# Patient Record
Sex: Female | Born: 1980 | Race: Black or African American | Hispanic: No | Marital: Single | Smoking: Never smoker
Health system: Southern US, Community
[De-identification: ages and names within clinical notes are randomized; demographics above are authoritative.]

## PROBLEM LIST (undated history)

## (undated) DIAGNOSIS — L500 Allergic urticaria: Secondary | ICD-10-CM

## (undated) DIAGNOSIS — R000 Tachycardia, unspecified: Secondary | ICD-10-CM

## (undated) DIAGNOSIS — I10 Essential (primary) hypertension: Secondary | ICD-10-CM

## (undated) HISTORY — DX: Essential (primary) hypertension: I10

## (undated) HISTORY — DX: Allergic urticaria: L50.0

## (undated) HISTORY — DX: Tachycardia, unspecified: R00.0

## (undated) HISTORY — PX: OTHER SURGICAL HISTORY: SHX169

---

## 2012-01-26 ENCOUNTER — Encounter (HOSPITAL_COMMUNITY): Payer: Self-pay | Admitting: *Deleted

## 2012-01-26 ENCOUNTER — Emergency Department (HOSPITAL_COMMUNITY)
Admission: EM | Admit: 2012-01-26 | Discharge: 2012-01-26 | Disposition: A | Discharge status: Home / Self Care | Payer: 59 | Attending: Emergency Medicine | Admitting: Emergency Medicine

## 2012-01-26 ENCOUNTER — Emergency Department (HOSPITAL_COMMUNITY): Payer: 59

## 2012-01-26 DIAGNOSIS — M549 Dorsalgia, unspecified: Secondary | ICD-10-CM | POA: Insufficient documentation

## 2012-01-26 MED ORDER — DIAZEPAM 5 MG PO TABS: 5.0000 mg | ORAL_TABLET | Freq: Two times a day (BID) | ORAL | Status: AC

## 2012-01-26 NOTE — ED Provider Notes (Signed)
History     CSN: [id number]  Arrival date & time 01/26/12  7829   First MD Initiated Contact with Patient 01/26/12 2142      Chief Complaint  Patient presents with  . Sent by [facility] for chest xray     (Consider location/radiation/quality/duration/timing/severity/associated sxs/prior treatment) HPI Comments: [facility] to arrival and was sent to the ED for a CXR because [facility] felt that breath sounds were decreased on the left side.  Patient is very anxious about this.  [facility] because she has having lower back pain.  Pain located across her lower back.  She reports that the back pain has been present for 2 days.  Pain worse with movements of the lower back.  She describes the pain as tightness.  She reports that pain does become worse with taking a deep breath.  She has not taken anything for pain.  She is unsure if she did any strenuous activity.  She denies any trauma.    Patient is a 31 y.o. female presenting with back pain. The history is provided by the patient.  Back Pain  This is a new problem. The current episode started 2 days ago. The problem occurs constantly. The problem has not changed since onset.The pain is associated with no known injury. The pain does not radiate. The pain is mild. The symptoms are aggravated by twisting and bending. Pertinent negatives include no chest pain, no fever, no numbness, no abdominal pain, no bowel incontinence, no perianal numbness, no bladder incontinence, no dysuria, no leg pain, no paresthesias, no paresis, no tingling and no weakness. She has tried nothing for the symptoms.    History reviewed. No pertinent past medical history.  Past Surgical History  Procedure Date  . Thumb surgery     No family history on file.  History  Substance Use Topics  . Smoking status: Never Smoker   . Smokeless tobacco: Not on file  . Alcohol Use: Yes   occasionally    OB History    Grav Para Term Preterm Abortions TAB SAB Ect Mult Living                  Review of Systems  Constitutional: Negative for fever, chills and diaphoresis.  HENT: Negative for neck pain and neck stiffness.   Respiratory: Negative for cough and shortness of breath.   Cardiovascular: Negative for chest pain and leg swelling.  Gastrointestinal: Negative for nausea, vomiting, abdominal pain and bowel incontinence.  Genitourinary: Negative for bladder incontinence, dysuria, urgency, frequency, hematuria, flank pain, decreased urine volume and difficulty urinating.  Musculoskeletal: Positive for back pain. Negative for gait problem.  Skin: Negative for rash.  Neurological: Negative for dizziness, tingling, syncope, weakness, light-headedness, numbness and paresthesias.    Allergies  Augmentin and Sulfa antibiotics  Home Medications   Current Outpatient Rx  Name Route Sig Dispense Refill  . LEVONORGEST-ETH ESTRAD 91-DAY 0.15-0.03 MG PO TABS Oral Take 1 tablet by mouth daily.    Marland Kitchen LORATADINE 10 MG PO TABS Oral Take 10 mg by mouth daily.    Marland Kitchen DIAZEPAM 5 MG PO TABS Oral Take 1 tablet (5 mg total) by mouth 2 (two) times daily. 10 tablet 0    BP 129/87  Pulse 107  Temp(Src) 98.9 F (37.2 C) (Oral)  Resp 20  SpO2 99%  LMP 12/02/2011  Physical Exam  Nursing note and vitals reviewed. Constitutional: She appears  well-developed and well-nourished.  HENT:  Head: Normocephalic and atraumatic.  Mouth/Throat: Oropharynx is clear and moist.  Neck: Normal range of motion. Neck supple.  Cardiovascular: Normal rate, regular rhythm and normal heart sounds.        No peripheral edema  Pulmonary/Chest: Effort normal and breath sounds normal. No accessory muscle usage. Not tachypneic. No respiratory distress. She has no decreased breath sounds. She has no wheezes. She has no rhonchi. She has no rales. She exhibits no tenderness.  Abdominal: Soft. There is no  tenderness.  Musculoskeletal: Normal range of motion.       Lumbar back: She exhibits normal range of motion, no tenderness, no bony tenderness, no swelling, no edema and no deformity.       Pain with ROM of lower back  Neurological: She is alert. She has normal strength. No sensory deficit. Gait normal.  Reflex Scores:      Patellar reflexes are 2+ on the right side and 2+ on the left side.      Achilles reflexes are 2+ on the right side and 2+ on the left side. Skin: Skin is warm and dry. No rash noted. She is not diaphoretic.  Psychiatric: Her mood appears anxious.    ED Course  Procedures (including critical care time)  Labs Reviewed - No data to display Dg Chest 2 View  01/26/2012  *RADIOLOGY REPORT*  Clinical Data: Pain upon deep inspiration.  CHEST - 2 VIEW  Comparison: No priors.  Findings: There is very mild blunting of the left costophrenic sulcus which may reflect a trace left-sided pleural effusion or some pleural scarring.  Lung volumes are normal.  No consolidative air space disease.  No pneumothorax.  No definite suspicious appearing pulmonary nodules or masses are identified.  Pulmonary vasculature and the cardiomediastinal silhouette are within normal limits.  IMPRESSION: 1.  Possible trace left-sided pleural effusion versus pleural scarring.  Original Report Authenticated By: Florencia Reasons, M.D.     1. Back pain       MDM  Patient presenting with lower back pain.  She was sent to the ED for a CXR after [facility] felt that breath sounds were decreased on left.  On my exam breath sounds were symmetric.  CXR results as above showing possible trace pleural effusion.  Patient denies CP or SOB.  Lower back pain appears to be muscular.  Pain worse with bending and twisting.  No spinous process tenderness.  No neurological deficits and normal neuro exam.  Patient can walk but states is painful.  No loss of bowel or bladder control.  No  concern for cauda equina.  No fever, night sweats, weight loss, h/o cancer, IVDU.  RICE protocol and pain medicine indicated and discussed with patient.         Pascal Lux [place], PA-C 01/27/12 1510

## 2012-01-26 NOTE — Discharge Instructions (Signed)
Take muscle relaxer as needed for back pain.  Do not drive or operate heavy machinery while taking medication.

## 2012-01-26 NOTE — ED Notes (Signed)
[facility], worse with inspiration. Was told by NP to come to ED for chest xray because she didn't hear many breath sounds on L side. Pt denies sob, cough.

## 2012-01-28 NOTE — ED Provider Notes (Signed)
Medical screening examination/treatment/procedure(s) were performed by non-physician practitioner and as supervising physician I was immediately available for consultation/collaboration.    Drezden Seitzinger R Eberardo Demello, MD 01/28/12 1102 

## 2015-08-27 ENCOUNTER — Other Ambulatory Visit: Payer: Self-pay | Admitting: Allergy and Immunology

## 2015-08-27 NOTE — Telephone Encounter (Signed)
PLEASE ADVISE.

## 2015-08-27 NOTE — Telephone Encounter (Signed)
Dr. Willa RoughHicks has pt taking Zyrtec every day. Pt is wondering if we can call in an RX to help bring down the cost. [facility] [address] pls advise

## 2015-08-28 MED ORDER — CETIRIZINE HCL 10 MG PO TABS: 10.0000 mg | ORAL_TABLET | Freq: Every day | ORAL | Status: DC

## 2015-08-28 NOTE — Telephone Encounter (Signed)
Sent in Zyrtec to patients pharmacy and patient notified.

## 2015-08-28 NOTE — Telephone Encounter (Signed)
Yes send  Zyrtec 10mg  once daily. #34 with 3 refills.

## 2015-11-24 ENCOUNTER — Ambulatory Visit (INDEPENDENT_AMBULATORY_CARE_PROVIDER_SITE_OTHER): Payer: Managed Care, Other (non HMO) | Admitting: Family Medicine

## 2015-11-24 VITALS — BP 122/72 | HR 105 | Temp 98.2°F | Resp 17 | Ht 63.0 in | Wt 178.0 lb

## 2015-11-24 DIAGNOSIS — J069 Acute upper respiratory infection, unspecified: Secondary | ICD-10-CM | POA: Diagnosis not present

## 2015-11-24 DIAGNOSIS — R05 Cough: Secondary | ICD-10-CM | POA: Diagnosis not present

## 2015-11-24 DIAGNOSIS — R059 Cough, unspecified: Secondary | ICD-10-CM

## 2015-11-24 MED ORDER — BENZONATATE 100 MG PO CAPS: 100.0000 mg | ORAL_CAPSULE | Freq: Three times a day (TID) | ORAL | Status: DC | PRN

## 2015-11-24 MED ORDER — IPRATROPIUM BROMIDE 0.03 % NA SOLN: 2.0000 | Freq: Two times a day (BID) | NASAL | Status: DC

## 2015-11-24 MED ORDER — GUAIFENESIN-CODEINE 100-10 MG/5ML PO SOLN: 5.0000 mL | Freq: Three times a day (TID) | ORAL | Status: DC | PRN

## 2015-11-24 NOTE — Patient Instructions (Addendum)
Drink plenty of fluids and try to get enough rest  Use the Atrovent (ipratropium) nasal spray 2 sprays in each nostril every 6 hours if needed for head congestion  Take the benzonatate cough pills one or 2 pills 3 times daily as needed for daytime cough. This does not tend to cause drowsiness and you should be able to take it when you're working if needed  Take the guaifenesin with codeine cough syrup 1 teaspoon every 4-6 hours when needed for cough. 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  Upper Respiratory Infection, Adult Most upper respiratory infections (URIs) are a viral infection of the air passages leading to the lungs. A URI affects the nose, throat, and upper air passages. The most common type of URI is nasopharyngitis and is typically referred to as "the common cold." URIs run their course and usually go away on their own. Most of the time, a URI does not require medical attention, but sometimes a bacterial infection in the upper airways can follow a viral infection. This is called a secondary infection. Sinus and middle ear infections are common types of secondary upper respiratory infections. Bacterial pneumonia can also complicate a URI. A URI can worsen asthma and chronic obstructive pulmonary disease (COPD). Sometimes, these complications can require emergency medical care and may be life threatening.  CAUSES Almost all URIs are caused by viruses. A virus is a type of germ and can spread from one person to another.  RISKS FACTORS You may be at risk for a URI if:   You smoke.   You have chronic heart or lung disease.  You have a weakened defense (immune) system.   You are very young or very old.   You have nasal allergies or asthma.  You work in crowded or poorly ventilated areas.  You work in health care facilities or schools. SIGNS AND SYMPTOMS  Symptoms typically develop 2-3 days after you come in contact  with a cold virus. Most viral URIs last 7-10 days. However, viral URIs from the influenza virus (flu virus) can last 14-18 days and are typically more severe. Symptoms may include:   Runny or stuffy (congested) nose.   Sneezing.   Cough.   Sore throat.   Headache.   Fatigue.   Fever.   Loss of appetite.   Pain in your forehead, behind your eyes, and over your cheekbones (sinus pain).  Muscle aches.  DIAGNOSIS  Your health care provider may diagnose a URI by:  Physical exam.  Tests to check that your symptoms are not due to another condition such as:  Strep throat.  Sinusitis.  Pneumonia.  Asthma. TREATMENT  A URI goes away on its own with time. It cannot be cured with medicines, but medicines may be prescribed or recommended to relieve symptoms. Medicines may help:  Reduce your fever.  Reduce your cough.  Relieve nasal congestion. HOME CARE INSTRUCTIONS   Take medicines only as directed by your health care provider.   Gargle warm saltwater or take cough drops to comfort your throat as directed by your health care provider.  Use a warm mist humidifier or inhale steam from a shower to increase air moisture. This may make it easier to breathe.  Drink enough fluid to keep your urine clear or pale yellow.   Eat soups and other clear broths and maintain good nutrition.   Rest as needed.   Return to work when your temperature has  returned to normal or as your health care provider advises. You may need to stay home longer to avoid infecting others. You can also use a face mask and careful hand washing to prevent spread of the virus.  Increase the usage of your inhaler if you have asthma.   Do not use any tobacco products, including cigarettes, chewing tobacco, or electronic cigarettes. If you need help quitting, ask your health care provider. PREVENTION  The best way to protect yourself from getting a cold is to practice good hygiene.    Avoid oral or hand contact with people with cold symptoms.   Wash your hands often if contact occurs.  There is no clear evidence that vitamin C, vitamin E, echinacea, or exercise reduces the chance of developing a cold. However, it is always recommended to get plenty of rest, exercise, and practice good nutrition.  SEEK MEDICAL CARE IF:   You are getting worse rather than better.   Your symptoms are not controlled by medicine.   You have chills.  You have worsening shortness of breath.  You have brown or red mucus.  You have yellow or brown nasal discharge.  You have pain in your face, especially when you bend forward.  You have a fever.  You have swollen neck glands.  You have pain while swallowing.  You have white areas in the back of your throat. SEEK IMMEDIATE MEDICAL CARE IF:   You have severe or persistent:  Headache.  Ear pain.  Sinus pain.  Chest pain.  You have chronic lung disease and any of the following:  Wheezing.  Prolonged cough.  Coughing up blood.  A change in your usual mucus.  You have a stiff neck.  You have changes in your:  Vision.  Hearing.  Thinking.  Mood. MAKE SURE YOU:   Understand these instructions.  Will watch your condition.  Will get help right away if you are not doing well or get worse.  This information is not intended to replace advice given to you by your health care provider. Make sure you discuss any questions you have with your health care provider.  Document Released: 02/03/2001 Document Revised: 12/25/2014 Document Reviewed: 11/15/2013 Elsevier Interactive Patient Education 2016 [employer]   IF you received an x-ray today, you will receive an invoice from [facility]. Please contact [facility] at [phone] with questions or concerns regarding your invoice.   IF you received labwork today, you will receive an invoice from [facility]. Please contact [facility] at [phone] with questions or concerns regarding your invoice.   Our billing staff will not be able to assist you with questions regarding bills from these companies.  You will be contacted with the lab results as soon as they are available. The fastest way to get your results is to activate your My Chart account. Instructions are located on the last page of this paperwork. If you have not heard from us regarding the results in 2 weeks, please contact this office.

## 2015-11-24 NOTE — Progress Notes (Signed)
Patient ID: Julie ChamberlainKimberlee D Wilkerson, female    DOB: 07/28/1981  Age: 35 y.o. MRN: [id number]  Chief Complaint  Patient presents with  . Cough  . Laryngitis  . URI  . Sinusitis  . Nasal Congestion    Subjective:   35 year old lady who is been ill since about Wednesday with a respiratory tract infection. She has had a little sore throat and hoarseness. She has had a runny nose and cough. Not been running fevers. The cough is been coming on more now. She didn't blowing mostly clear and coughing mostly clear type mucus. She is a nonsmoker. 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 Others around her throat with ear infections to work. She lives alone. She has taken some Mucinex sinus.  She does have a history of respiratory allergies, has some Flonase.  Current allergies, medications, problem list, past/family and social histories reviewed.  Objective:  BP 122/72 mmHg  Pulse 105  Temp(Src) 98.2 F (36.8 C) (Oral)  Resp 17  Ht 5\' 3"  (1.6 m)  Wt 178 lb (80.74 kg)  BMI 31.54 kg/m2  SpO2 98%  LMP 09/11/2015  No major distress but she obviously is very congested. Her nose is running and she has to blow often. Her TMs are normal. Throat not erythematous. Neck supple without significant nodes. Chest is clear to auscultation without rhonchi, rales, or wheezes. Heart was regular without murmurs. Skin feels normal temperature. She is afebrile.  Assessment & Plan:   Assessment: 1. Cough   2. Acute upper respiratory infection       Plan: This is a bad upper respiratory cold infection. It should run its course over the next several days though the cough may linger. Will treat symptomatically. She is to let me know if things are getting worse.  No orders of the defined types were placed in this encounter.    Meds ordered this encounter  Medications  . benzonatate (TESSALON) 100 MG capsule    Sig: Take 1-2 capsules (100-200 mg total) by mouth 3 (three) times daily as needed.   Dispense:  30 capsule    Refill:  0  . guaiFENesin-codeine 100-10 MG/5ML syrup    Sig: Take 5 mLs by mouth 3 (three) times daily as needed for cough.    Dispense:  120 mL    Refill:  0  . ipratropium (ATROVENT) 0.03 % nasal spray    Sig: Place 2 sprays into both nostrils 2 (two) times daily.    Dispense:  30 mL    Refill:  0         Patient Instructions    Drink plenty of fluids and try to get enough rest  Use the Atrovent (ipratropium) nasal spray 2 sprays in each nostril every 6 hours if needed for head congestion  Take the benzonatate cough pills one or 2 pills 3 times daily as needed for daytime cough. This does not tend to cause drowsiness and you should be able to take it when you're working if needed  Take the guaifenesin with codeine cough syrup 1 teaspoon every 4-6 hours when needed for cough. 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  Upper Respiratory Infection, Adult Most upper respiratory infections (URIs) are a viral infection of the air passages leading to the lungs. A URI affects the nose, throat, and upper air passages. The most common type of URI is nasopharyngitis and is typically referred to as "the common  cold." URIs run their course and usually go away on their own. Most of the time, a URI does not require medical attention, but sometimes a bacterial infection in the upper airways can follow a viral infection. This is called a secondary infection. Sinus and middle ear infections are common types of secondary upper respiratory infections. Bacterial pneumonia can also complicate a URI. A URI can worsen asthma and chronic obstructive pulmonary disease (COPD). Sometimes, these complications can require emergency medical care and may be life threatening.  CAUSES Almost all URIs are caused by viruses. A virus is a type of germ and can spread from one person to another.  RISKS FACTORS You may be at risk for a URI  if:   You smoke.   You have chronic heart or lung disease.  You have a weakened defense (immune) system.   You are very young or very old.   You have nasal allergies or asthma.  You work in crowded or poorly ventilated areas.  You work in health care facilities or schools. SIGNS AND SYMPTOMS  Symptoms typically develop 2-3 days after you come in contact with a cold virus. Most viral URIs last 7-10 days. However, viral URIs from the influenza virus (flu virus) can last 14-18 days and are typically more severe. Symptoms may include:   Runny or stuffy (congested) nose.   Sneezing.   Cough.   Sore throat.   Headache.   Fatigue.   Fever.   Loss of appetite.   Pain in your forehead, behind your eyes, and over your cheekbones (sinus pain).  Muscle aches.  DIAGNOSIS  Your health care provider may diagnose a URI by:  Physical exam.  Tests to check that your symptoms are not due to another condition such as:  Strep throat.  Sinusitis.  Pneumonia.  Asthma. TREATMENT  A URI goes away on its own with time. It cannot be cured with medicines, but medicines may be prescribed or recommended to relieve symptoms. Medicines may help:  Reduce your fever.  Reduce your cough.  Relieve nasal congestion. HOME CARE INSTRUCTIONS   Take medicines only as directed by your health care provider.   Gargle warm saltwater or take cough drops to comfort your throat as directed by your health care provider.  Use a warm mist humidifier or inhale steam from a shower to increase air moisture. This may make it easier to breathe.  Drink enough fluid to keep your urine clear or pale yellow.   Eat soups and other clear broths and maintain good nutrition.   Rest as needed.   Return to work when your temperature has returned to normal or as your health care provider advises. You may need to stay home longer to avoid infecting others. You can also use a face mask and  careful hand washing to prevent spread of the virus.  Increase the usage of your inhaler if you have asthma.   Do not use any tobacco products, including cigarettes, chewing tobacco, or electronic cigarettes. If you need help quitting, ask your health care provider. PREVENTION  The best way to protect yourself from getting a cold is to practice good hygiene.   Avoid oral or hand contact with people with cold symptoms.   Wash your hands often if contact occurs.  There is no clear evidence that vitamin C, vitamin E, echinacea, or exercise reduces the chance of developing a cold. However, it is always recommended to get plenty of rest, exercise, and practice good nutrition.  SEEK MEDICAL CARE IF:   You are getting worse rather than better.   Your symptoms are not controlled by medicine.   You have chills.  You have worsening shortness of breath.  You have brown or red mucus.  You have yellow or brown nasal discharge.  You have pain in your face, especially when you bend forward.  You have a fever.  You have swollen neck glands.  You have pain while swallowing.  You have white areas in the back of your throat. SEEK IMMEDIATE MEDICAL CARE IF:   You have severe or persistent:  Headache.  Ear pain.  Sinus pain.  Chest pain.  You have chronic lung disease and any of the following:  Wheezing.  Prolonged cough.  Coughing up blood.  A change in your usual mucus.  You have a stiff neck.  You have changes in your:  Vision.  Hearing.  Thinking.  Mood. MAKE SURE YOU:   Understand these instructions.  Will watch your condition.  Will get help right away if you are not doing well or get worse.  This information is not intended to replace advice given to you by your health care provider. Make sure you discuss any questions you have with your health care provider.  Document Released: 02/03/2001 Document Revised: 12/25/2014 Document Reviewed:  11/15/2013 Elsevier Interactive Patient Education 2016 [employer].   IF you received an x-ray today, you will receive an invoice from [facility]. Please contact [facility] at [phone] with questions or concerns regarding your invoice.   IF you received labwork today, you will receive an invoice from [facility]. Please contact [facility] at [phone] with questions or concerns regarding your invoice.   Our billing staff will not be able to assist you with questions regarding bills from these companies.  You will be contacted with the lab results as soon as they are available. The fastest way to get your results is to activate your My Chart account. Instructions are located on the last page of this paperwork. If you have not heard from [place] regarding the results in 2 weeks, please contact this office.        The [facility] office is of scattered around [place], and many of them are taking some new patients. [facility] also is taking some patients understand.  Return if symptoms worsen or fail to improve.   Ivis Henneman, MD 11/24/2015

## 2016-03-05 ENCOUNTER — Encounter: Payer: Self-pay | Admitting: Allergy and Immunology

## 2016-03-05 ENCOUNTER — Ambulatory Visit (INDEPENDENT_AMBULATORY_CARE_PROVIDER_SITE_OTHER): Payer: Managed Care, Other (non HMO) | Admitting: Allergy and Immunology

## 2016-03-05 VITALS — BP 110/72 | HR 100 | Temp 98.3°F | Resp 16 | Ht 62.5 in | Wt 178.6 lb

## 2016-03-05 DIAGNOSIS — L500 Allergic urticaria: Secondary | ICD-10-CM | POA: Diagnosis not present

## 2016-03-05 DIAGNOSIS — J3089 Other allergic rhinitis: Secondary | ICD-10-CM | POA: Diagnosis not present

## 2016-03-05 DIAGNOSIS — J309 Allergic rhinitis, unspecified: Secondary | ICD-10-CM | POA: Insufficient documentation

## 2016-03-05 DIAGNOSIS — R05 Cough: Secondary | ICD-10-CM

## 2016-03-05 DIAGNOSIS — T7840XA Allergy, unspecified, initial encounter: Secondary | ICD-10-CM | POA: Insufficient documentation

## 2016-03-05 DIAGNOSIS — R059 Cough, unspecified: Secondary | ICD-10-CM | POA: Insufficient documentation

## 2016-03-05 HISTORY — DX: Allergic urticaria: L50.0

## 2016-03-05 HISTORY — DX: Cough, unspecified: R05.9

## 2016-03-05 HISTORY — DX: Allergy, unspecified, initial encounter: T78.40XA

## 2016-03-05 MED ORDER — PREDNISONE 1 MG PO TABS: 10.0000 mg | ORAL_TABLET | ORAL | Status: DC

## 2016-03-05 MED ORDER — LEVOCETIRIZINE DIHYDROCHLORIDE 5 MG PO TABS: 5.0000 mg | ORAL_TABLET | Freq: Every evening | ORAL | Status: DC

## 2016-03-05 NOTE — Patient Instructions (Addendum)
Allergic reaction Unclear etiology.  Allergic reaction versus idiopathic urticaria.  We were unable to perform skin tests today due to recent administration of antihistamine.   Instructions have been discussed and provided for H1/H2 receptor blockade with titration to find lowest effective dose.  A prescription has been provided for levocetirizine, 5 mg daily as needed.  The patient is scheduled to return in the next 1-2 weeks for allergy skin testing after having been off of antihistamines for at least 3 days.    To control symptoms while off of antihistamines, prednisone has been provided: 10g per day for 3 days.  Further recommendations will be made at that time based upon skin test results. Should significant symptoms recur or new symptoms occur, a journal is to be kept recording any foods eaten, beverages consumed, medications taken, activities performed, and environmental conditions within a 6 hour time period prior to the onset of symptoms. For any symptoms concerning for anaphylaxis, 911 is to be called immediately.   Allergic rhinitis  For now, continue allergen avoidance measures, fluticasone nasal spray, nasal saline irrigation, and guaifenesin as needed.  Further recommendations made after skin testing next visit.  Intermittent coughing This has most likely been related to upper airway cough syndrome.  Currently quiescent.  Treatment plan as outlined above for allergic rhinitis.    Return in about 2 weeks (around 03/19/2016) for allegy skin testing.  Urticaria (Hives)  . Levocetirizine (Xyzal) 5 mg twice a day and ranitidine (Zantac) 150 mg twice a day. If no symptoms for 7-14 days then decrease to. . Levocetirizine (Xyzal) 5 mg twice a day and ranitidine (Zantac) 150 mg once a day.  If no symptoms for 7-14 days then decrease to. . Levocetirizine (Xyzal) 5 mg twice a day.  If no symptoms for 7-14 days then decrease to. . Levocetirizine (Xyzal) 5 mg once a day.  May use  Benadryl (diphenhydramine) as needed for breakthrough symptoms       If symptoms return, then step up dosage

## 2016-03-05 NOTE — Progress Notes (Signed)
Follow-up Note  RE: Julie Lucas MRN: [id number] DOB: 08/09/1981 Date of Office Visit: 03/05/2016  Primary care provider: No PCP Per Patient Referring provider: No ref. provider found  History of present illness: HPI Comments: Julie Lucas is a 35 y.o. Female, with a history of rhinoconjunctivitis and intermittent coughing, presenting today for a new problem.  She was last seen in this clinic in July 2016.  She reports that 6 days ago she began "itching really bad" around her abdomen, back, and posterior neck.  She noticed small hives on her neck and torso.  She has been taking hydroxyzine or cetirizine in an attempt to control the pruritus.  No specific medication, food, or environmental triggers have been identified.  She denies concomitant angioedema, cardiopulmonary symptoms, or GI symptoms.  She has no nasal, ocular, or sinus symptom complaints today.  She has no complaints of coughing today.   Assessment and plan: Allergic reaction Unclear etiology.  Allergic reaction versus idiopathic urticaria.  We were unable to perform skin tests today due to recent administration of antihistamine.   Instructions have been discussed and provided for H1/H2 receptor blockade with titration to find lowest effective dose.  A prescription has been provided for levocetirizine, 5 mg daily as needed.  The patient is scheduled to return in the next 1-2 weeks for allergy skin testing after having been off of antihistamines for at least 3 days.    To control symptoms while off of antihistamines, prednisone has been provided: 10g per day for 3 days.  Further recommendations will be made at that time based upon skin test results. Should significant symptoms recur or new symptoms occur, a journal is to be kept recording any foods eaten, beverages consumed, medications taken, activities performed, and environmental conditions within a 6 hour time period prior to the onset of symptoms. For any  symptoms concerning for anaphylaxis, 911 is to be called immediately.   Allergic rhinitis  For now, continue allergen avoidance measures, fluticasone nasal spray, nasal saline irrigation, and guaifenesin as needed.  Further recommendations made after skin testing next visit.  Intermittent coughing This has most likely been related to upper airway cough syndrome.  Currently quiescent.  Treatment plan as outlined above for allergic rhinitis.    Meds ordered this encounter  Medications  . predniSONE (DELTASONE) tablet 10 mg    Sig:   . levocetirizine (XYZAL) 5 MG tablet    Sig: Take 1 tablet (5 mg total) by mouth every evening.    Dispense:  30 tablet    Refill:  5    Diagnositics: We were unable to perform skin tests today due to recent administration of antihistamine.      Physical examination: Blood pressure 110/72, pulse 100, temperature 98.3 F (36.8 C), temperature source Oral, resp. rate 16, height 5' 2.5" (1.588 m), weight 178 lb 9.6 oz (81.012 kg), SpO2 96 %.  General: Alert, interactive, in no acute distress. HEENT: TMs pearly gray, turbinates mildly edematous without discharge, post-pharynx mildly erythematous. Neck: Supple without lymphadenopathy. Lungs: Clear to auscultation without wheezing, rhonchi or rales. CV: Normal S1, S2 without murmurs. Skin: Scattered erythematous urticarial type lesions primarily located on the posterior neck , nonvesicular.  The following portions of the patient's history were reviewed and updated as appropriate: allergies, current medications, past family history, past medical history, past social history, past surgical history and problem list.    Medication List       This list is accurate as of: 03/05/16  7:56 PM.  Always use your most recent med list.               benzonatate 100 MG capsule  Commonly known as:  TESSALON  Take 1-2 capsules (100-200 mg total) by mouth 3 (three) times daily as needed.     cetirizine 10  MG tablet  Commonly known as:  ZYRTEC  Take 1 tablet (10 mg total) by mouth daily.     fluticasone 50 MCG/ACT nasal spray  Commonly known as:  FLONASE     guaiFENesin-codeine 100-10 MG/5ML syrup  Take 5 mLs by mouth 3 (three) times daily as needed for cough.     hydrocortisone 2.5 % ointment     ipratropium 0.03 % nasal spray  Commonly known as:  ATROVENT  Place 2 sprays into both nostrils 2 (two) times daily.     levocetirizine 5 MG tablet  Commonly known as:  XYZAL  Take 1 tablet (5 mg total) by mouth every evening.     levonorgestrel-ethinyl estradiol 0.15-0.03 MG tablet  Commonly known as:  SEASONALE,INTROVALE,JOLESSA  Take 1 tablet by mouth daily.     loratadine 10 MG tablet  Commonly known as:  CLARITIN  Take 10 mg by mouth daily. Reported on 11/24/2015        Allergies  Allergen Reactions  . Augmentin [Amoxicillin-Pot Clavulanate] Nausea And Vomiting  . Sulfa Antibiotics Hives   Review of systems: Constitutional: Negative for fever, chills and weight loss.  HENT: Negative for nosebleeds.   Eyes: Negative for blurred vision.  Respiratory: Negative for hemoptysis.   Cardiovascular: Negative for chest pain.  Gastrointestinal: Negative for diarrhea and constipation.  Genitourinary: Negative for dysuria.  Musculoskeletal: Negative for myalgias and joint pain.  Neurological: Negative for dizziness.  Endo/Heme/Allergies: Does not bruise/bleed easily.  Cutaneous: Positive for pruritus, urticaria.  Past Medical History  Diagnosis Date  . Allergic urticaria 03/05/2016    Family History  Problem Relation Age of Onset  . Allergic rhinitis Mother   . Allergic rhinitis Father   . Angioedema Neg Hx   . Asthma Neg Hx   . Eczema Neg Hx   . Immunodeficiency Neg Hx   . Urticaria Neg Hx     Social History   Social History  . Marital Status: Single    Spouse Name: N/A  . Number of Children: N/A  . Years of Education: N/A   Occupational History  . Not on file.     Social History Main Topics  . Smoking status: Never Smoker   . Smokeless tobacco: Not on file  . Alcohol Use: Yes     Comment: occasionally  . Drug Use: No  . Sexual Activity: Not on file   Other Topics Concern  . Not on file   Social History Narrative    I appreciate the opportunity to take part in Shayla's care. Please do not hesitate to contact me with questions.  Sincerely,   R. Jorene Guest, MD

## 2016-03-05 NOTE — Assessment & Plan Note (Addendum)
   For now, continue allergen avoidance measures, fluticasone nasal spray, nasal saline irrigation, and guaifenesin as needed.  Further recommendations made after skin testing next visit.

## 2016-03-05 NOTE — Assessment & Plan Note (Addendum)
Unclear etiology.  Allergic reaction versus idiopathic urticaria.  We were unable to perform skin tests today due to recent administration of antihistamine.   Instructions have been discussed and provided for H1/H2 receptor blockade with titration to find lowest effective dose.  A prescription has been provided for levocetirizine, 5 mg daily as needed.  The patient is scheduled to return in the next 1-2 weeks for allergy skin testing after having been off of antihistamines for at least 3 days.    To control symptoms while off of antihistamines, prednisone has been provided: 10g per day for 3 days.  Further recommendations will be made at that time based upon skin test results. Should significant symptoms recur or new symptoms occur, a journal is to be kept recording any foods eaten, beverages consumed, medications taken, activities performed, and environmental conditions within a 6 hour time period prior to the onset of symptoms. For any symptoms concerning for anaphylaxis, 911 is to be called immediately.

## 2016-03-05 NOTE — Assessment & Plan Note (Signed)
This has most likely been related to upper airway cough syndrome.  Currently quiescent.  Treatment plan as outlined above for allergic rhinitis.

## 2016-03-10 ENCOUNTER — Telehealth: Payer: Self-pay | Admitting: Allergy and Immunology

## 2016-03-10 NOTE — Telephone Encounter (Signed)
DR BOBBITT WHAT DO YOU WANT TO ADVISE HER REGARDING TESTING

## 2016-03-10 NOTE — Telephone Encounter (Signed)
She is scheduled to come in for testing on July 31 but she wants to know what exactly she is being tested for as she has a high deductible and was wondering if it is testing for foods. If so, what foods will be included in the testing. She only wants to be tested for foods that she eats.

## 2016-03-11 NOTE — Telephone Encounter (Signed)
She would be able to select the foods that we test according to what she eats.  She is welcome to continue the H1/H2 receptor blockade schedule and keep a symptom exposure journal for now if she is hesitant about testing.  Thanks.

## 2016-03-11 NOTE — Telephone Encounter (Signed)
Informed patient that would be able to do a select number of test according to foods she believes may be the problem. Patient will keep appointment for now but will call back if she decide to cancel.

## 2016-03-23 ENCOUNTER — Ambulatory Visit: Payer: Managed Care, Other (non HMO) | Admitting: Allergy and Immunology

## 2016-07-22 ENCOUNTER — Other Ambulatory Visit: Payer: Self-pay

## 2016-07-22 MED ORDER — FLUTICASONE PROPIONATE 50 MCG/ACT NA SUSP: 2.0000 | Freq: Every day | NASAL | 5 refills | Status: DC | PRN

## 2016-09-05 ENCOUNTER — Ambulatory Visit (INDEPENDENT_AMBULATORY_CARE_PROVIDER_SITE_OTHER): Payer: Managed Care, Other (non HMO) | Admitting: Physician Assistant

## 2016-09-05 VITALS — BP 158/110 | HR 127 | Temp 99.1°F | Resp 17 | Ht 64.0 in | Wt 185.2 lb

## 2016-09-05 DIAGNOSIS — R079 Chest pain, unspecified: Secondary | ICD-10-CM

## 2016-09-05 DIAGNOSIS — K219 Gastro-esophageal reflux disease without esophagitis: Secondary | ICD-10-CM | POA: Diagnosis not present

## 2016-09-05 LAB — POCT CBC
GRANULOCYTE PERCENT: 57.4 % (ref 37–80)
HEMATOCRIT: 39.1 % (ref 37.7–47.9)
Hemoglobin: 13.8 g/dL (ref 12.2–16.2)
Lymph, poc: 1.6 (ref 0.6–3.4)
MCH, POC: 30.4 pg (ref 27–31.2)
MCHC: 35.2 g/dL (ref 31.8–35.4)
MCV: 86.3 fL (ref 80–97)
MID (CBC): 0.4 (ref 0–0.9)
MPV: 8.4 fL (ref 0–99.8)
POC GRANULOCYTE: 2.8 (ref 2–6.9)
POC LYMPH %: 33.3 % (ref 10–50)
POC MID %: 9.3 % (ref 0–12)
Platelet Count, POC: 322 10*3/uL (ref 142–424)
RBC: 4.52 M/uL (ref 4.04–5.48)
RDW, POC: 12.4 %
WBC: 4.8 10*3/uL (ref 4.6–10.2)

## 2016-09-05 MED ORDER — OMEPRAZOLE 20 MG PO CPDR
20.0000 mg | DELAYED_RELEASE_CAPSULE | Freq: Every day | ORAL | 3 refills | Status: DC
Start: 2016-09-05 — End: 2016-10-26

## 2016-09-05 MED ORDER — VERAPAMIL HCL ER 240 MG PO CP24: 240.0000 mg | ORAL_CAPSULE | Freq: Every day | ORAL | 0 refills | Status: DC

## 2016-09-05 MED ORDER — CARVEDILOL 6.25 MG PO TABS: 6.2500 mg | ORAL_TABLET | Freq: Two times a day (BID) | ORAL | 1 refills | Status: DC

## 2016-09-05 NOTE — Progress Notes (Signed)
[facility] [address], [place] [address] [phone]  Date:  09/05/2016   Name:  Julie Lucas   DOB:  1980-09-15   MRN:  [id number]  PCP:  Molli Barrows, FNP    History of Present Illness:  Julie Lucas is a 36 y.o. female patient who presents to [facility] for chest pain, and to establish care.   5 days ago with heart burn.  She started taking nexium, burning and under the the breast lining.  She then had radiated burning up her neck.  No nausea.  No sob.  Minimal coughing.  She had no dizziness or diaphoresis.  The burning sensation resolved 2 days later, but then returned to last night.   Symptoms started after eating pizza.  She started eating bland foods for now.  The pain may worsen when supine.   Her blood pressure is elevated at this time.  She states that she is mildly nervous. She denies leg swelling.    Wt Readings from Last 3 Encounters:  09/05/16 185 lb 4 oz (84 kg)  03/05/16 178 lb 9.6 oz (81 kg)  11/24/15 178 lb (80.7 kg)     Patient Active Problem List   Diagnosis Date Noted  . Allergic reaction 03/05/2016  . Allergic rhinitis 03/05/2016  . Intermittent coughing 03/05/2016  . 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 Allergic urticaria 03/05/2016    Past Surgical History:  Procedure Laterality Date  . thumb surgery      Social History  Substance Use Topics  . Smoking status: Never Smoker  . Smokeless tobacco: Not on file  . Alcohol use Yes     Comment: occasionally    Family History  Problem Relation Age of Onset  . Allergic rhinitis Mother   . Allergic rhinitis Father   . Angioedema Neg Hx   . Asthma Neg Hx   . Eczema Neg Hx   . Immunodeficiency Neg Hx   . Urticaria Neg Hx     Allergies  Allergen Reactions  . Augmentin [Amoxicillin-Pot Clavulanate] Nausea And Vomiting  . Sulfa Antibiotics Hives    Medication list has been reviewed and updated.  Current  Outpatient Prescriptions on File Prior to Visit  Medication Sig Dispense Refill  . fluticasone (FLONASE) 50 MCG/ACT nasal spray Place 2 sprays into both nostrils daily as needed for allergies or rhinitis. 16 g 5  . levocetirizine (XYZAL) 5 MG tablet Take 1 tablet (5 mg total) by mouth every evening. 30 tablet 5  . levonorgestrel-ethinyl estradiol (SEASONALE,INTROVALE,JOLESSA) 0.15-0.03 MG tablet Take 1 tablet by mouth daily.    . benzonatate (TESSALON) 100 MG capsule Take 1-2 capsules (100-200 mg total) by mouth 3 (three) times daily as needed. (Patient not taking: Reported on 09/05/2016) 30 capsule 0  . cetirizine (ZYRTEC) 10 MG tablet Take 1 tablet (10 mg total) by mouth daily. (Patient not taking: Reported on 09/05/2016) 34 tablet 3  . guaiFENesin-codeine 100-10 MG/5ML syrup Take 5 mLs by mouth 3 (three) times daily as needed for cough. (Patient not taking: Reported on 09/05/2016) 120 mL 0  . hydrocortisone 2.5 % ointment     . ipratropium (ATROVENT) 0.03 % nasal spray Place 2 sprays into both nostrils 2 (two) times daily. (Patient not taking: Reported on 09/05/2016) 30 mL 0  . loratadine (CLARITIN) 10 MG tablet Take 10 mg by mouth daily. Reported on 11/24/2015     Current Facility-Administered Medications on  File Prior to Visit  Medication Dose Route Frequency Provider Last Rate Last Dose  . predniSONE (DELTASONE) tablet 10 mg  10 mg Oral UD Adelina Mings, MD        ROS ROS otherwise unremarkable unless listed above  Physical Examination: BP (!) 158/110   Pulse (!) 127   Temp 99.1 F (37.3 C) (Oral)   Resp 17   Ht 5' 4"  (1.626 m)   Wt 185 lb 4 oz (84 kg)   LMP 08/26/2016 (Exact Date)   SpO2 100%   BMI 31.80 kg/m  Ideal Body Weight: Weight in (lb) to have BMI = 25: 145.3  Physical Exam  Constitutional: She is oriented to person, place, and time. She appears well-developed and well-nourished. No distress.  HENT:  Head: Normocephalic and atraumatic.  Right Ear: External ear  normal.  Left Ear: External ear normal.  Eyes: Conjunctivae and EOM are normal. Pupils are equal, round, and reactive to light.  Cardiovascular: Normal rate, regular rhythm and normal heart sounds.  Exam reveals no gallop and no friction rub.   No murmur heard. Pulmonary/Chest: Effort normal. No respiratory distress.  Abdominal: Soft. Bowel sounds are normal. She exhibits no distension. There is no tenderness.  Musculoskeletal: She exhibits no edema.  Neurological: She is alert and oriented to person, place, and time.  Skin: Skin is warm and dry. Capillary refill takes less than 2 seconds. She is not diaphoretic.  Psychiatric: She has a normal mood and affect. Her behavior is normal.     Assessment and Plan: Julie Lucas is a 36 y.o. female who is here today for cc of chest pain. This does appear to be GERD Advised to use her otc ranitidine 181m bid, and omeprazole.  Given gerd friendly diet. We will give her anti-hypertensive today.  Rechecked and over 1028systolic. 2 readings, will pursue anti-hypertensive that drives blood pressure and heart rate. Gastric reflux - Plan: omeprazole (PRILOSEC) 20 MG capsule  Chest pain, unspecified type - Plan: POCT CBC, CMP14+EGFR, TSH, EKG 12-Lead  SIvar Drape PA-C [facility] 1/14/20187:18 PM

## 2016-09-05 NOTE — Patient Instructions (Signed)
Please take the medications as prescribed.  I would like you to follow up in 10 days-2 weeks.   I would like you to follow the list below in regards to the reflux.    Food Choices for Gastroesophageal Reflux Disease, Adult When you have gastroesophageal reflux disease (GERD), the foods you eat and your eating habits are very important. Choosing the right foods can help ease your discomfort. What guidelines do I need to follow?  Choose fruits, vegetables, whole grains, and low-fat dairy products.  Choose low-fat meat, fish, and poultry.  Limit fats such as oils, salad dressings, butter, nuts, and avocado.  Keep a food diary. This helps you identify foods that cause symptoms.  Avoid foods that cause symptoms. These may be different for everyone.  Eat small meals often instead of 3 large meals a day.  Eat your meals slowly, in a place where you are relaxed.  Limit fried foods.  Cook foods using methods other than frying.  Avoid drinking alcohol.  Avoid drinking large amounts of liquids with your meals.  Avoid bending over or lying down until 2-3 hours after eating. What foods are not recommended? These are some foods and drinks that may make your symptoms worse: Vegetables  Tomatoes. Tomato juice. Tomato and spaghetti sauce. Chili peppers. Onion and garlic. Horseradish. Fruits  Oranges, grapefruit, and lemon (fruit and juice). Meats  High-fat meats, fish, and poultry. This includes hot dogs, ribs, ham, sausage, salami, and bacon. Dairy  Whole milk and chocolate milk. Sour cream. Cream. Butter. Ice cream. Cream cheese. Drinks  Coffee and tea. Bubbly (carbonated) drinks or energy drinks. Condiments  Hot sauce. Barbecue sauce. Sweets/Desserts  Chocolate and cocoa. Donuts. Peppermint and spearmint. Fats and Oils  High-fat foods. This includes [place] fries and potato chips. Other  Vinegar. Strong spices. This includes black pepper, white pepper, red pepper, cayenne, curry  powder, cloves, ginger, and chili powder. The items listed above may not be a complete list of foods and drinks to avoid. Contact your dietitian for more information.  This information is not intended to replace advice given to you by your health care provider. Make sure you discuss any questions you have with your health care provider. Document Released: 02/09/2012 Document Revised: 01/16/2016 Document Reviewed: 06/14/2013 Elsevier Interactive Patient Education  2017 Elsevier Inc.  DASH Eating Plan DASH stands for "Dietary Approaches to Stop Hypertension." The DASH eating plan is a healthy eating plan that has been shown to reduce high blood pressure (hypertension). Additional health benefits may include reducing the risk of type 2 diabetes mellitus, heart disease, and stroke. The DASH eating plan may also help with weight loss. What do I need to know about the DASH eating plan? For the DASH eating plan, you will follow these general guidelines:  Choose foods with less than 150 milligrams of sodium per serving (as listed on the food label).  Use salt-free seasonings or herbs instead of table salt or sea salt.  Check with your health care provider or pharmacist before using salt substitutes.  Eat lower-sodium products. These are often labeled as "low-sodium" or "no salt added."  Eat fresh foods. Avoid eating a lot of canned foods.  Eat more vegetables, fruits, and low-fat dairy products.  Choose whole grains. Look for the word "whole" as the first word in the ingredient list.  Choose fish and skinless chicken or [place] more often than red meat. Limit fish, poultry, and meat to 6 oz (170 g) each day.  Limit sweets,  desserts, sugars, and sugary drinks.  Choose heart-healthy fats.  Eat more home-cooked food and less restaurant, buffet, and fast food.  Limit fried foods.  Do not fry foods. Cook foods using methods such as baking, boiling, grilling, and broiling instead.  When eating  at a restaurant, ask that your food be prepared with less salt, or no salt if possible. What foods can I eat? Seek help from a dietitian for individual calorie needs. Grains  Whole grain or whole wheat bread. Brown rice. Whole grain or whole wheat pasta. Quinoa, bulgur, and whole grain cereals. Low-sodium cereals. Corn or whole wheat flour tortillas. Whole grain cornbread. Whole grain crackers. Low-sodium crackers. Vegetables  Fresh or frozen vegetables (raw, steamed, roasted, or grilled). Low-sodium or reduced-sodium tomato and vegetable juices. Low-sodium or reduced-sodium tomato sauce and paste. Low-sodium or reduced-sodium canned vegetables. Fruits  All fresh, canned (in natural juice), or frozen fruits. Meat and Other Protein Products  Ground beef (85% or leaner), grass-fed beef, or beef trimmed of fat. Skinless chicken or [place]. Ground chicken or [place]. Pork trimmed of fat. All fish and seafood. Eggs. Dried beans, peas, or lentils. Unsalted nuts and seeds. Unsalted canned beans. Dairy  Low-fat dairy products, such as skim or 1% milk, 2% or reduced-fat cheeses, low-fat ricotta or cottage cheese, or plain low-fat yogurt. Low-sodium or reduced-sodium cheeses. Fats and Oils  Tub margarines without trans fats. Light or reduced-fat mayonnaise and salad dressings (reduced sodium). Avocado. Safflower, olive, or canola oils. Natural peanut or almond butter. Other  Unsalted popcorn and pretzels. The items listed above may not be a complete list of recommended foods or beverages. Contact your dietitian for more options.  What foods are not recommended? Grains  White bread. White pasta. White rice. Refined cornbread. Bagels and croissants. Crackers that contain trans fat. Vegetables  Creamed or fried vegetables. Vegetables in a cheese sauce. Regular canned vegetables. Regular canned tomato sauce and paste. Regular tomato and vegetable juices. Fruits  Canned fruit in light or heavy syrup. Fruit  juice. Meat and Other Protein Products  Fatty cuts of meat. Ribs, chicken wings, bacon, sausage, bologna, salami, chitterlings, fatback, hot dogs, bratwurst, and packaged luncheon meats. Salted nuts and seeds. Canned beans with salt. Dairy  Whole or 2% milk, cream, half-and-half, and cream cheese. Whole-fat or sweetened yogurt. Full-fat cheeses or blue cheese. Nondairy creamers and whipped toppings. Processed cheese, cheese spreads, or cheese curds. Condiments  Onion and garlic salt, seasoned salt, table salt, and sea salt. Canned and packaged gravies. Worcestershire sauce. Tartar sauce. Barbecue sauce. Teriyaki sauce. Soy sauce, including reduced sodium. Steak sauce. Fish sauce. Oyster sauce. Cocktail sauce. Horseradish. Ketchup and mustard. Meat flavorings and tenderizers. Bouillon cubes. Hot sauce. Tabasco sauce. Marinades. Taco seasonings. Relishes. Fats and Oils  Butter, stick margarine, lard, shortening, ghee, and bacon fat. Coconut, palm kernel, or palm oils. Regular salad dressings. Other  Pickles and olives. Salted popcorn and pretzels. The items listed above may not be a complete list of foods and beverages to avoid. Contact your dietitian for more information.  Where can I find more information? National Heart, Lung, and Blood Institute: CablePromo.itwww.nhlbi.nih.gov/health/health-topics/topics/dash/ This information is not intended to replace advice given to you by your health care provider. Make sure you discuss any questions you have with your health care provider. Document Released: 07/30/2011 Document Revised: 01/16/2016 Document Reviewed: 06/14/2013 Elsevier Interactive Patient Education  2017 [employer]

## 2016-09-06 LAB — CMP14+EGFR
ALBUMIN: 4.3 g/dL (ref 3.5–5.5)
ALK PHOS: 53 IU/L (ref 39–117)
ALT: 15 IU/L (ref 0–32)
AST: 13 IU/L (ref 0–40)
Albumin/Globulin Ratio: 1.3 (ref 1.2–2.2)
BILIRUBIN TOTAL: 0.8 mg/dL (ref 0.0–1.2)
BUN / CREAT RATIO: 11 (ref 9–23)
BUN: 7 mg/dL (ref 6–20)
CHLORIDE: 100 mmol/L (ref 96–106)
CO2: 20 mmol/L (ref 18–29)
Calcium: 9.5 mg/dL (ref 8.7–10.2)
Creatinine, Ser: 0.63 mg/dL (ref 0.57–1.00)
GFR calc Af Amer: 136 mL/min/{1.73_m2} (ref >59)
GFR calc non Af Amer: 118 mL/min/{1.73_m2} (ref >59)
GLOBULIN, TOTAL: 3.2 g/dL (ref 1.5–4.5)
Glucose: 80 mg/dL (ref 65–99)
POTASSIUM: 4.7 mmol/L (ref 3.5–5.2)
SODIUM: 139 mmol/L (ref 134–144)
Total Protein: 7.5 g/dL (ref 6.0–8.5)

## 2016-09-06 LAB — TSH: TSH: 0.954 u[IU]/mL (ref 0.450–4.500)

## 2016-09-07 ENCOUNTER — Telehealth: Payer: Self-pay

## 2016-09-07 ENCOUNTER — Encounter: Payer: Managed Care, Other (non HMO) | Admitting: Physician Assistant

## 2016-09-07 VITALS — BP 136/88

## 2016-09-07 DIAGNOSIS — Z09 Encounter for follow-up examination after completed treatment for conditions other than malignant neoplasm: Secondary | ICD-10-CM

## 2016-09-07 NOTE — Telephone Encounter (Signed)
PATIENT STATES STEPHANIE PRESCRIBED HER A BLOOD PRESSURE MEDICINE ON Saturday CALLED CARVEDILOL 6.25 mg. SHE TOOK IT ON Saturday AND JUST FELT A LITTLE TIRED. WHEN SHE TOOK IT ON Sunday MORNING SHE FELT VERY TIRED AND HAD NO ENERGY. SHE DID NOT TAKE IT ON Sunday AFTERNOON. WHAT SHOULD SHE DO NEXT? BEST PHONE [phone] (CELL) PHARMACY CHOICE IS [facility] ON [address].  MBC

## 2016-09-07 NOTE — Telephone Encounter (Signed)
Saw patient today at fast track appt.

## 2016-09-09 ENCOUNTER — Encounter: Payer: Self-pay | Admitting: Physician Assistant

## 2016-09-09 NOTE — Progress Notes (Signed)
bp check only without being seen

## 2016-10-13 ENCOUNTER — Ambulatory Visit (INDEPENDENT_AMBULATORY_CARE_PROVIDER_SITE_OTHER): Payer: 59 | Admitting: Physician Assistant

## 2016-10-13 ENCOUNTER — Encounter: Payer: Self-pay | Admitting: Physician Assistant

## 2016-10-13 VITALS — BP 177/121 | HR 132 | Temp 98.1°F | Ht 64.0 in | Wt 180.8 lb

## 2016-10-13 DIAGNOSIS — R030 Elevated blood-pressure reading, without diagnosis of hypertension: Secondary | ICD-10-CM | POA: Diagnosis not present

## 2016-10-13 NOTE — Patient Instructions (Addendum)
I would like you to start taking the carvedilol in the evening for the first week.  Then add the second tablet.  Make sure they are 12 hours apart.  Check your blood pressure twice per week, and bring those numbers back in 2.5-3 weeks, when we recheck you.    DASH Eating Plan DASH stands for "Dietary Approaches to Stop Hypertension." The DASH eating plan is a healthy eating plan that has been shown to reduce high blood pressure (hypertension). Additional health benefits may include reducing the risk of type 2 diabetes mellitus, heart disease, and stroke. The DASH eating plan may also help with weight loss. What do I need to know about the DASH eating plan? For the DASH eating plan, you will follow these general guidelines:  Choose foods with less than 150 milligrams of sodium per serving (as listed on the food label).  Use salt-free seasonings or herbs instead of table salt or sea salt.  Check with your health care provider or pharmacist before using salt substitutes.  Eat lower-sodium products. These are often labeled as "low-sodium" or "no salt added."  Eat fresh foods. Avoid eating a lot of canned foods.  Eat more vegetables, fruits, and low-fat dairy products.  Choose whole grains. Look for the word "whole" as the first word in the ingredient list.  Choose fish and skinless chicken or [place] more often than red meat. Limit fish, poultry, and meat to 6 oz (170 g) each day.  Limit sweets, desserts, sugars, and sugary drinks.  Choose heart-healthy fats.  Eat more home-cooked food and less restaurant, buffet, and fast food.  Limit fried foods.  Do not fry foods. Cook foods using methods such as baking, boiling, grilling, and broiling instead.  When eating at a restaurant, ask that your food be prepared with less salt, or no salt if possible. What foods can I eat? Seek help from a dietitian for individual calorie needs. Grains  Whole grain or whole wheat bread. Brown rice. Whole  grain or whole wheat pasta. Quinoa, bulgur, and whole grain cereals. Low-sodium cereals. Corn or whole wheat flour tortillas. Whole grain cornbread. Whole grain crackers. Low-sodium crackers. Vegetables  Fresh or frozen vegetables (raw, steamed, roasted, or grilled). Low-sodium or reduced-sodium tomato and vegetable juices. Low-sodium or reduced-sodium tomato sauce and paste. Low-sodium or reduced-sodium canned vegetables. Fruits  All fresh, canned (in natural juice), or frozen fruits. Meat and Other Protein Products  Ground beef (85% or leaner), grass-fed beef, or beef trimmed of fat. Skinless chicken or [place]. Ground chicken or [place]. Pork trimmed of fat. All fish and seafood. Eggs. Dried beans, peas, or lentils. Unsalted nuts and seeds. Unsalted canned beans. Dairy  Low-fat dairy products, such as skim or 1% milk, 2% or reduced-fat cheeses, low-fat ricotta or cottage cheese, or plain low-fat yogurt. Low-sodium or reduced-sodium cheeses. Fats and Oils  Tub margarines without trans fats. Light or reduced-fat mayonnaise and salad dressings (reduced sodium). Avocado. Safflower, olive, or canola oils. Natural peanut or almond butter. Other  Unsalted popcorn and pretzels. The items listed above may not be a complete list of recommended foods or beverages. Contact your dietitian for more options.  What foods are not recommended? Grains  White bread. White pasta. White rice. Refined cornbread. Bagels and croissants. Crackers that contain trans fat. Vegetables  Creamed or fried vegetables. Vegetables in a cheese sauce. Regular canned vegetables. Regular canned tomato sauce and paste. Regular tomato and vegetable juices. Fruits  Canned fruit in light or heavy syrup.  Fruit juice. Meat and Other Protein Products  Fatty cuts of meat. Ribs, chicken wings, bacon, sausage, bologna, salami, chitterlings, fatback, hot dogs, bratwurst, and packaged luncheon meats. Salted nuts and seeds. Canned beans with  salt. Dairy  Whole or 2% milk, cream, half-and-half, and cream cheese. Whole-fat or sweetened yogurt. Full-fat cheeses or blue cheese. Nondairy creamers and whipped toppings. Processed cheese, cheese spreads, or cheese curds. Condiments  Onion and garlic salt, seasoned salt, table salt, and sea salt. Canned and packaged gravies. Worcestershire sauce. Tartar sauce. Barbecue sauce. Teriyaki sauce. Soy sauce, including reduced sodium. Steak sauce. Fish sauce. Oyster sauce. Cocktail sauce. Horseradish. Ketchup and mustard. Meat flavorings and tenderizers. Bouillon cubes. Hot sauce. Tabasco sauce. Marinades. Taco seasonings. Relishes. Fats and Oils  Butter, stick margarine, lard, shortening, ghee, and bacon fat. Coconut, palm kernel, or palm oils. Regular salad dressings. Other  Pickles and olives. Salted popcorn and pretzels. The items listed above may not be a complete list of foods and beverages to avoid. Contact your dietitian for more information.  Where can I find more information? National Heart, Lung, and Blood Institute: CablePromo.itwww.nhlbi.nih.gov/health/health-topics/topics/dash/ This information is not intended to replace advice given to you by your health care provider. Make sure you discuss any questions you have with your health care provider. Document Released: 07/30/2011 Document Revised: 01/16/2016 Document Reviewed: 06/14/2013 Elsevier Interactive Patient Education  2017 [employer]     IF you received an x-ray today, you will receive an invoice from [facility]. Please contact [facility] at [phone] with questions or concerns regarding your invoice.   IF you received labwork today, you will receive an invoice from [facility]. Please contact [facility] at [phone] with questions or concerns regarding your invoice.   Our billing staff will not be able to assist you with questions regarding bills from these companies.  You will be contacted with the lab results as  soon as they are available. The fastest way to get your results is to activate your My Chart account. Instructions are located on the last page of this paperwork. If you have not heard from us regarding the results in 2 weeks, please contact this office.

## 2016-10-13 NOTE — Progress Notes (Signed)
[facility] [address], [place] [id number] [phone]  Date:  10/13/2016   Name:  Julie ChamberlainKimberlee D Lucas   DOB:  11/24/1980   MRN:  [id number]  PCP:  Julie PlattStephanie Hilery Wintle, PA    History of Present Illness:  Julie Lucas is a 36 y.o. female patient who [facility] of bp followup.    Patient was seen here 5 weeks ago after multiple reports of elevated blood pressure.  She attempted to start the verapamil, and stopped taking it as she was feeling very tired, and lightheaded.  She had no nausea.  No sob or chest pains.   Mother checked blood pressure with systolic at 140 and 150.   She ate yogurt or oatmeal with the medication, and took it with her dinner.   She is avoiding salt intake.  No sodas for 1 month.     Patient Active Problem List   Diagnosis Date Noted  . Allergic reaction 03/05/2016  . Allergic rhinitis 03/05/2016  . Intermittent coughing 03/05/2016  . 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 Allergic urticaria 03/05/2016    Past Surgical History:  Procedure Laterality Date  . thumb surgery      Social History  Substance Use Topics  . Smoking status: Never Smoker  . Smokeless tobacco: Never Used  . Alcohol use Yes     Comment: occasionally    Family History  Problem Relation Age of Onset  . Allergic rhinitis Mother   . Allergic rhinitis Father   . Angioedema Neg Hx   . Asthma Neg Hx   . Eczema Neg Hx   . Immunodeficiency Neg Hx   . Urticaria Neg Hx     Allergies  Allergen Reactions  . Augmentin [Amoxicillin-Pot Clavulanate] Nausea And Vomiting  . Sulfa Antibiotics Hives    Medication list has been reviewed and updated.  Current Outpatient Prescriptions on File Prior to Visit  Medication Sig Dispense Refill  . carvedilol (COREG) 6.25 MG tablet Take 1 tablet (6.25 mg total) by mouth 2 (two) times daily with a meal. 60 tablet 1  . cetirizine (ZYRTEC) 10 MG tablet Take 1  tablet (10 mg total) by mouth daily. 34 tablet 3  . fluticasone (FLONASE) 50 MCG/ACT nasal spray Place 2 sprays into both nostrils daily as needed for allergies or rhinitis. 16 g 5  . hydrocortisone 2.5 % ointment     . levocetirizine (XYZAL) 5 MG tablet Take 1 tablet (5 mg total) by mouth every evening. 30 tablet 5  . levonorgestrel-ethinyl estradiol (SEASONALE,INTROVALE,JOLESSA) 0.15-0.03 MG tablet Take 1 tablet by mouth daily.    Marland Kitchen. omeprazole (PRILOSEC) 20 MG capsule Take 1 capsule (20 mg total) by mouth daily. 30 capsule 3  . verapamil (VERELAN PM) 240 MG 24 hr capsule Take 1 capsule (240 mg total) by mouth at bedtime. 30 capsule 0  . benzonatate (TESSALON) 100 MG capsule Take 1-2 capsules (100-200 mg total) by mouth 3 (three) times daily as needed. (Patient not taking: Reported on 10/13/2016) 30 capsule 0  . esomeprazole (NEXIUM) 20 MG capsule Take 20 mg by mouth daily at 12 noon.    Marland Kitchen. guaiFENesin-codeine 100-10 MG/5ML syrup Take 5 mLs by mouth 3 (three) times daily as needed for cough. (Patient not taking: Reported on 10/13/2016) 120 mL 0  . ipratropium (ATROVENT) 0.03 % nasal spray Place 2 sprays into both nostrils 2 (two) times daily. (Patient not taking:  Reported on 10/13/2016) 30 mL 0  . loratadine (CLARITIN) 10 MG tablet Take 10 mg by mouth daily. Reported on 11/24/2015     Current Facility-Administered Medications on File Prior to Visit  Medication Dose Route Frequency Provider Last Rate Last Dose  . predniSONE (DELTASONE) tablet 10 mg  10 mg Oral UD Cristal Ford, MD        ROS ROS otherwise unremarkable unless listed above.   Physical Examination: BP (!) 180/119 (BP Location: Right Arm, Patient Position: Sitting, Cuff Size: Large)   Pulse (!) 132   Temp 98.1 F (36.7 C) (Oral)   Ht 5\' 4"  (1.626 m)   Wt 180 lb 12.8 oz (82 kg)   LMP 08/26/2016   SpO2 100%   BMI 31.03 kg/m  Ideal Body Weight: Weight in (lb) to have BMI = 25: 145.3  Physical Exam  Constitutional: She is  oriented to person, place, and time. She appears well-developed and well-nourished. No distress.  HENT:  Head: Normocephalic and atraumatic.  Right Ear: External ear normal.  Left Ear: External ear normal.  Eyes: Conjunctivae and EOM are normal. Pupils are equal, round, and reactive to light.  Cardiovascular: Regular rhythm and normal heart sounds.  Tachycardia present.  Exam reveals no gallop and no friction rub.   No murmur heard. Pulses:      Carotid pulses are 2+ on the right side, and 2+ on the left side.      Radial pulses are 2+ on the right side, and 2+ on the left side.  Pulmonary/Chest: Effort normal. No respiratory distress.  Neurological: She is alert and oriented to person, place, and time.  Skin: She is not diaphoretic.  Psychiatric: She has a normal mood and affect. Her behavior is normal.     Assessment and Plan: Julie Lucas is a 36 y.o. female who is here today for bp follow up. It appears that her bp needs to be controlled by anti-hypertensives.  I am starting her back on verapamil.  I have advised that she take just one per day for 1 week.  She will then take it twice per day. rtc in 2 weeks for recheck. Elevated BP without diagnosis of hypertension  Julie Platt, PA-C [facility] 2/28/201810:47 PM

## 2016-10-26 ENCOUNTER — Ambulatory Visit (INDEPENDENT_AMBULATORY_CARE_PROVIDER_SITE_OTHER): Payer: 59 | Admitting: Family Medicine

## 2016-10-26 ENCOUNTER — Encounter: Payer: Self-pay | Admitting: Family Medicine

## 2016-10-26 VITALS — BP 186/126 | HR 100 | Temp 99.1°F | Resp 18 | Ht 64.0 in | Wt 181.0 lb

## 2016-10-26 DIAGNOSIS — R000 Tachycardia, unspecified: Secondary | ICD-10-CM | POA: Diagnosis not present

## 2016-10-26 DIAGNOSIS — I160 Hypertensive urgency: Secondary | ICD-10-CM | POA: Diagnosis not present

## 2016-10-26 MED ORDER — CLONIDINE HCL 0.1 MG PO TABS
0.1000 mg | ORAL_TABLET | Freq: Once | ORAL | Status: AC
  Administered 2016-10-26: 0.1 mg via ORAL

## 2016-10-26 MED ORDER — LISINOPRIL-HYDROCHLOROTHIAZIDE 10-12.5 MG PO TABS
1.0000 | ORAL_TABLET | Freq: Every day | ORAL | 1 refills | Status: DC
Start: 2016-10-26 — End: 2016-11-12

## 2016-10-26 MED ORDER — ASPIRIN EC 81 MG PO TBEC: 81.0000 mg | DELAYED_RELEASE_TABLET | Freq: Every day | ORAL | Status: DC

## 2016-10-26 MED ORDER — CARVEDILOL 12.5 MG PO TABS: 12.5000 mg | ORAL_TABLET | Freq: Two times a day (BID) | ORAL | 1 refills | Status: DC

## 2016-10-26 NOTE — Patient Instructions (Addendum)
Wait until after your next period, then do not restart your next pack of birth control pills - lets just see how you do off of them for just one month - less if you start to get abnormal periods. But finish out your current pack.  Check your blood pressure tonight around 7pm - if >150 /90, double up on  Your carvedilol tonight Start on your new blood pressure pill - lisinopril-hctz - and the higher dose of carvedilol tomorrow morning. Check your blood pressure in 3-4d and again in 2d later.  If still >150/90 at each reading, take 2 tabs of the lisinopril-hctz daily until you come back to see me.      IF you received an x-ray today, you will receive an invoice from [facility]. Please contact [facility] at [phone] with questions or concerns regarding your invoice.   IF you received labwork today, you will receive an invoice from [facility]. Please contact [facility] at [phone] with questions or concerns regarding your invoice.   Our billing staff will not be able to assist you with questions regarding bills from these companies.  You will be contacted with the lab results as soon as they are available. The fastest way to get your results is to activate your My Chart account. Instructions are located on the last page of this paperwork. If you have not heard from us regarding the results in 2 weeks, please contact this office.     Managing Your Hypertension Hypertension is commonly called high blood pressure. This is when the force of your blood pressing against the walls of your arteries is too strong. Arteries are blood vessels that carry blood from your heart throughout your body. Hypertension forces the heart to work harder to pump blood, and may cause the arteries to become narrow or stiff. Having untreated or uncontrolled hypertension can cause heart attack, stroke, kidney disease, and other problems. What are blood pressure readings? A blood pressure reading  consists of a higher number over a lower number. Ideally, your blood pressure should be below 120/80. The first ("top") number is called the systolic pressure. It is a measure of the pressure in your arteries as your heart beats. The second ("bottom") number is called the diastolic pressure. It is a measure of the pressure in your arteries as the heart relaxes. What does my blood pressure reading mean? Blood pressure is classified into four stages. Based on your blood pressure reading, your health care provider may use the following stages to determine what type of treatment you need, if any. Systolic pressure and diastolic pressure are measured in a unit called mm Hg. Normal   Systolic pressure: below 120.  Diastolic pressure: below 80. Elevated   Systolic pressure: 120-129.  Diastolic pressure: below 80. Hypertension stage 1     Diastolic pressure: 80-89. Hypertension stage 2   Systolic pressure: 140 or above.  Diastolic pressure: 90 or above. What health risks are associated with hypertension? Managing your hypertension is an important responsibility. Uncontrolled hypertension can lead to:  A heart attack.  A stroke.  A weakened blood vessel (aneurysm).  Heart failure.  Kidney damage.  Eye damage.  Metabolic syndrome.  Memory and concentration problems. What changes can I make to manage my hypertension? Eating and drinking   Eat a diet that is high in fiber and potassium, and low in salt (sodium), added sugar, and fat. An example eating plan is called the DASH (Dietary Approaches to Stop Hypertension) diet. To eat  this way:  Eat plenty of fresh fruits and vegetables. Try to fill half of your plate at each meal with fruits and vegetables.  Eat whole grains, such as whole wheat pasta, brown rice, or whole grain bread. Fill about one quarter of your plate with whole grains.  Eat low-fat diary products.  Avoid fatty cuts of meat, processed or cured meats, and  poultry with skin. Fill about one quarter of your plate with lean proteins such as fish, chicken without skin, beans, eggs, and tofu.  Avoid premade and processed foods. These tend to be higher in sodium, added sugar, and fat.     Lifestyle   Work with your health care provider to maintain a healthy body weight, or to lose weight. Ask what an ideal weight is for you.  Get at least 30 minutes of exercise that causes your heart to beat faster (aerobic exercise) most days of the week. Activities may include walking, swimming, or biking.       Monitoring   Monitor your blood pressure at home as told by your health care provider. Your personal target blood pressure may vary depending on your medical conditions, your age, and other factors.  Have your blood pressure checked regularly, as often as told by your health care provider. Working with your health care provider   Review all the medicines you take with your health care provider because there may be side effects or interactions.  Talk with your health care provider about your diet, exercise habits, and other lifestyle factors that may be contributing to hypertension.  Visit your health care provider regularly. Your health care provider can help you create and adjust your plan for managing hypertension. Will I need medicine to control my blood pressure? Your health care provider may prescribe medicine if lifestyle changes are not enough to get your blood pressure under control, and if:  Your systolic blood pressure is 130 or higher.  Your diastolic blood pressure is 80 or higher. Take medicines only as told by your health care provider. Follow the directions carefully. Blood pressure medicines must be taken as prescribed. The medicine does not work as well when you skip doses. Skipping doses also puts you at risk for problems. Contact a health care provider if:  You think you are having a reaction to medicines you have  taken.  You have repeated (recurrent) headaches.  You feel dizzy.  You have swelling in your ankles.  You have trouble with your vision. Get help right away if:  You develop a severe headache or confusion.  You have unusual weakness or numbness, or you feel faint.  You have severe pain in your chest or abdomen.  You vomit repeatedly.  You have trouble breathing. Summary  Hypertension is when the force of blood pumping through your arteries is too strong. If this condition is not controlled, it may put you at risk for serious complications.  Your personal target blood pressure may vary depending on your medical conditions, your age, and other factors. For most people, a normal blood pressure is less than 120/80.  Hypertension is managed by lifestyle changes, medicines, or both. Lifestyle changes include weight loss, eating a healthy, low-sodium diet, exercising more, and limiting alcohol. This information is not intended to replace advice given to you by your health care provider. Make sure you discuss any questions you have with your health care provider. Document Released: 05/04/2012 Document Revised: 07/08/2016 Document Reviewed: 07/08/2016 Elsevier Interactive Patient Education  2017 Elsevier  Inc.  DASH Eating Plan DASH stands for "Dietary Approaches to Stop Hypertension." The DASH eating plan is a healthy eating plan that has been shown to reduce high blood pressure (hypertension). It may also reduce your risk for type 2 diabetes, heart disease, and stroke. The DASH eating plan may also help with weight loss. What are tips for following this plan? General guidelines   Avoid eating more than 2,300 mg (milligrams) of salt (sodium) a day. If you have hypertension, you may need to reduce your sodium intake to 1,500 mg a day.  Limit alcohol intake to no more than 1 drink a day for nonpregnant women and 2 drinks a day for men. One drink equals 12 oz of beer, 5 oz of wine, or 1  oz of hard liquor.  Work with your health care provider to maintain a healthy body weight or to lose weight. Ask what an ideal weight is for you.  Get at least 30 minutes of exercise that causes your heart to beat faster (aerobic exercise) most days of the week. Activities may include walking, swimming, or biking.  Work with your health care provider or diet and nutrition specialist (dietitian) to adjust your eating plan to your individual calorie needs. Reading food labels   Check food labels for the amount of sodium per serving. Choose foods with less than 5 percent of the Daily Value of sodium. Generally, foods with less than 300 mg of sodium per serving fit into this eating plan.  To find whole grains, look for the word "whole" as the first word in the ingredient list. Shopping   Buy products labeled as "low-sodium" or "no salt added."  Buy fresh foods. Avoid canned foods and premade or frozen meals. Cooking   Avoid adding salt when cooking. Use salt-free seasonings or herbs instead of table salt or sea salt. Check with your health care provider or pharmacist before using salt substitutes.  Do not fry foods. Cook foods using healthy methods such as baking, boiling, grilling, and broiling instead.  Cook with heart-healthy oils, such as olive, canola, soybean, or sunflower oil. Meal planning    Eat a balanced diet that includes:  5 or more servings of fruits and vegetables each day. At each meal, try to fill half of your plate with fruits and vegetables.  Up to 6-8 servings of whole grains each day.  Less than 6 oz of lean meat, poultry, or fish each day. A 3-oz serving of meat is about the same size as a deck of cards. One egg equals 1 oz.  2 servings of low-fat dairy each day.  A serving of nuts, seeds, or beans 5 times each week.  Heart-healthy fats. Healthy fats called Omega-3 fatty acids are found in foods such as flaxseeds and coldwater fish, like sardines, salmon, and  mackerel.  Limit how much you eat of the following:  Canned or prepackaged foods.  Food that is high in trans fat, such as fried foods.  Food that is high in saturated fat, such as fatty meat.  Sweets, desserts, sugary drinks, and other foods with added sugar.  Full-fat dairy products.  Do not salt foods before eating.  Try to eat at least 2 vegetarian meals each week.  Eat more home-cooked food and less restaurant, buffet, and fast food.  When eating at a restaurant, ask that your food be prepared with less salt or no salt, if possible. What foods are recommended? The items listed may not be a  complete list. Talk with your dietitian about what dietary choices are best for you. Grains  Whole-grain or whole-wheat bread. Whole-grain or whole-wheat pasta. Brown rice. Orpah Cobb. Bulgur. Whole-grain and low-sodium cereals. Pita bread. Low-fat, low-sodium crackers. Whole-wheat flour tortillas. Vegetables  Fresh or frozen vegetables (raw, steamed, roasted, or grilled). Low-sodium or reduced-sodium tomato and vegetable juice. Low-sodium or reduced-sodium tomato sauce and tomato paste. Low-sodium or reduced-sodium canned vegetables. Fruits  All fresh, dried, or frozen fruit. Canned fruit in natural juice (without added sugar). Meat and other protein foods  Skinless chicken or [place]. Ground chicken or [place]. Pork with fat trimmed off. Fish and seafood. Egg whites. Dried beans, peas, or lentils. Unsalted nuts, nut butters, and seeds. Unsalted canned beans. Lean cuts of beef with fat trimmed off. Low-sodium, lean deli meat. Dairy  Low-fat (1%) or fat-free (skim) milk. Fat-free, low-fat, or reduced-fat cheeses. Nonfat, low-sodium ricotta or cottage cheese. Low-fat or nonfat yogurt. Low-fat, low-sodium cheese. Fats and oils  Soft margarine without trans fats. Vegetable oil. Low-fat, reduced-fat, or light mayonnaise and salad dressings (reduced-sodium). Canola, safflower, olive, soybean,  and sunflower oils. Avocado. Seasoning and other foods  Herbs. Spices. Seasoning mixes without salt. Unsalted popcorn and pretzels. Fat-free sweets. What foods are not recommended? The items listed may not be a complete list. Talk with your dietitian about what dietary choices are best for you. Grains  Baked goods made with fat, such as croissants, muffins, or some breads. Dry pasta or rice meal packs. Vegetables  Creamed or fried vegetables. Vegetables in a cheese sauce. Regular canned vegetables (not low-sodium or reduced-sodium). Regular canned tomato sauce and paste (not low-sodium or reduced-sodium). Regular tomato and vegetable juice (not low-sodium or reduced-sodium). Rosita Fire. Olives. Fruits  Canned fruit in a light or heavy syrup. Fried fruit. Fruit in cream or butter sauce. Meat and other protein foods  Fatty cuts of meat. Ribs. Fried meat. Tomasa Blase. Sausage. Bologna and other processed lunch meats. Salami. Fatback. Hotdogs. Bratwurst. Salted nuts and seeds. Canned beans with added salt. Canned or smoked fish. Whole eggs or egg yolks. Chicken or [place] with skin. Dairy  Whole or 2% milk, cream, and half-and-half. Whole or full-fat cream cheese. Whole-fat or sweetened yogurt. Full-fat cheese. Nondairy creamers. Whipped toppings. Processed cheese and cheese spreads. Fats and oils  Butter. Stick margarine. Lard. Shortening. Ghee. Bacon fat. Tropical oils, such as coconut, palm kernel, or palm oil. Seasoning and other foods  Salted popcorn and pretzels. Onion salt, garlic salt, seasoned salt, table salt, and sea salt. Worcestershire sauce. Tartar sauce. Barbecue sauce. Teriyaki sauce. Soy sauce, including reduced-sodium. Steak sauce. Canned and packaged gravies. Fish sauce. Oyster sauce. Cocktail sauce. Horseradish that you find on the shelf. Ketchup. Mustard. Meat flavorings and tenderizers. Bouillon cubes. Hot sauce and Tabasco sauce. Premade or packaged marinades. Premade or packaged taco  seasonings. Relishes. Regular salad dressings. Where to find more information:  National Heart, Lung, and Blood Institute: PopSteam.is  American Heart Association: www.heart.org Summary  The DASH eating plan is a healthy eating plan that has been shown to reduce high blood pressure (hypertension). It may also reduce your risk for type 2 diabetes, heart disease, and stroke.  With the DASH eating plan, you should limit salt (sodium) intake to 2,300 mg a day. If you have hypertension, you may need to reduce your sodium intake to 1,500 mg a day.  When on the DASH eating plan, aim to eat more fresh fruits and vegetables, whole grains, lean proteins, low-fat dairy, and heart-healthy  fats.  Work with your health care provider or diet and nutrition specialist (dietitian) to adjust your eating plan to your individual calorie needs. This information is not intended to replace advice given to you by your health care provider. Make sure you discuss any questions you have with your health care provider. Document Released: 07/30/2011 Document Revised: 08/03/2016 Document Reviewed: 08/03/2016 Elsevier Interactive Patient Education  2017 [employer].

## 2016-10-26 NOTE — Progress Notes (Signed)
Subjective:    Patient ID: Julie Lucas, female    DOB: [birth year], 36 y.o.   MRN: [id number] Chief Complaint  Patient presents with  . Hypertension    BP medication not working      HPI Julie Lucas is a 36 yo woman who has been dealing with   She was on [place] and it changed to Introvale (levonorgesterel/ethinyl estradiol 0.15mg /0.003mg ). Has been on OCPs since she was 36 yo due to "severe cycles". Her mother is a Engineer, civil (consulting) and checks her BP for her at times.  Her cuff at home has been running about 168-170.   She is chronically tachycardic here, even prior to the elev BP, but pt reports her BP at home has been running about 100.  Both parents (both reported onset in mid 56s, early 30s) and brother(52 yo, onset around 85 yo) have HTN. No OSA, No FHx obesity. MGF DMII Sleeps well, wakes well rested, occ feels tired during the day but not consistent. Drinks 1/2 cup coffee in the morning. No EtoH, no smoking. Normal GU, normal appetite - good. Started going to the gym 2-3x/d treadmill, bike, lift weights for 45-66min.   Past Medical History:  Diagnosis Date  . Allergic urticaria 03/05/2016  . Hypertension    Past Surgical History:  Procedure Laterality Date  . thumb surgery     Current Outpatient Prescriptions on File Prior to Visit  Medication Sig Dispense Refill  . carvedilol (COREG) 6.25 MG tablet Take 1 tablet (6.25 mg total) by mouth 2 (two) times daily with a meal. 60 tablet 1  . fluticasone (FLONASE) 50 MCG/ACT nasal spray Place 2 sprays into both nostrils daily as needed for allergies or rhinitis. 16 g 5  . hydrocortisone 2.5 % ointment     . levocetirizine (XYZAL) 5 MG tablet Take 1 tablet (5 mg total) by mouth every evening. 30 tablet 5  . levonorgestrel-ethinyl estradiol (SEASONALE,INTROVALE,JOLESSA) 0.15-0.03 MG tablet Take 1 tablet by mouth daily.     Current Facility-Administered Medications on File Prior to Visit  Medication Dose Route Frequency Provider  Last Rate Last Dose  . predniSONE (DELTASONE) tablet 10 mg  10 mg Oral UD Cristal Ford, MD       Allergies  Allergen Reactions  . Augmentin [Amoxicillin-Pot Clavulanate] Nausea And Vomiting  . Sulfa Antibiotics Hives   Family History  Problem Relation Age of Onset  . Allergic rhinitis Mother   . Allergic rhinitis Father   . Angioedema Neg Hx   . Asthma Neg Hx   . Eczema Neg Hx   . Immunodeficiency Neg Hx   . Urticaria Neg Hx    Social History   Social History  . Marital status: Single    Spouse name: N/A  . Number of children: N/A  . Years of education: N/A   Social History Main Topics  . Smoking status: Never Smoker  . Smokeless tobacco: Never Used  . Alcohol use Yes     Comment: occasionally  . Drug use: No  . Sexual activity: Not Asked   Other Topics Concern  . None   Social History Narrative  . None   Depression screen [facility] 2/9 11/12/2016 10/26/2016 10/13/2016 09/05/2016 11/24/2015  Decreased Interest 0 0 0 0 0  Down, Depressed, Hopeless 0 0 0 0 0  PHQ - 2 Score 0 0 0 0 0     Review of Systems  Constitutional: Positive for activity change. Negative for appetite change, chills, diaphoresis, fatigue, fever  and unexpected weight change.  Eyes: Negative for visual disturbance.  Respiratory: Negative for cough, chest tightness and shortness of breath.   Cardiovascular: Negative for chest pain, palpitations and leg swelling.  Genitourinary: Negative for decreased urine volume.  Neurological: Negative for syncope and headaches.  Hematological: Does not bruise/bleed easily.  Psychiatric/Behavioral: Negative for dysphoric mood and sleep disturbance. The patient is nervous/anxious.        Objective:   Physical Exam  Constitutional: She is oriented to person, place, and time. She appears well-developed and well-nourished. No distress.  HENT:  Head: Normocephalic and atraumatic.  Right Ear: External ear normal.  Left Ear: External ear normal.  Eyes:  Conjunctivae are normal. No scleral icterus.  Neck: Normal range of motion. Neck supple. No thyromegaly present.  Cardiovascular: Normal rate, regular rhythm, normal heart sounds and intact distal pulses.   Pulmonary/Chest: Effort normal and breath sounds normal. No respiratory distress.  Musculoskeletal: She exhibits no edema.  Lymphadenopathy:    She has no cervical adenopathy.  Neurological: She is alert and oriented to person, place, and time.  Skin: Skin is warm and dry. She is not diaphoretic. No erythema.  Psychiatric: She has a normal mood and affect. Her behavior is normal.    BP (!) 186/126   Pulse 100   Temp 99.1 F (37.3 C) (Oral)   Resp 18   Ht 5\' 4"  (1.626 m)   Wt 181 lb (82.1 kg)   LMP 08/24/2016   SpO2 99%   BMI 31.07 kg/m      Assessment & Plan:   1. Hypertensive urgency   2. Chronic tachycardia   Needs workup for possible secondary etiology of malignant hypertension considering the patient's only risk factor is she is mildly obese but otherwise young and healthy. She appears to practice a reasonably healthy lifestyle and has certainly made excellent changes to improve that. We will start with referral to cardiology due to chronic tachycardia and Dopplers to evaluate for renal artery stenosis. Will hold off on referral for sleep study at this point as patient endorses no OSA symptoms inc no significant daytime fatigue but if BP continues to be resistant this may be worth proceeding with despite lack of other overt symptoms. BP appears to be consistently elevated so no need to workup for pheo. Recommend she consider one month trial off of OCPs to ensure these are not exacerbating symptoms though certainly with lisinopril she will need to be maintained on contraception in the long run.  Orders Placed This Encounter  Procedures  . US Renal Artery Stenosis    Epic order    Standing Status:   Future    Standing Expiration Date:   12/26/2017    Order Specific Question:    Reason for Exam (SYMPTOM  OR DIAGNOSIS REQUIRED)    Answer:   eval for renal artery stenosis as cause of secondary hypertension    Order Specific Question:   Preferred imaging location?    Answer:   [facility]  . US Renal    Standing Status:   Future    Standing Expiration Date:   12/30/2017    Order Specific Question:   Reason for Exam (SYMPTOM  OR DIAGNOSIS REQUIRED)    Answer:   per radiology protocol, sudden onset malignant HTN in young health woman, eval for secondary causes    Order Specific Question:   Preferred imaging location?    Answer:   [facility]  . Basic metabolic panel  Order Specific Question:   Has the patient fasted?    Answer:   No  . Microalbumin/Creatinine Ratio, Urine  . Ambulatory referral to Cardiology    Referral Priority:   Routine    Referral Type:   Consultation    Referral Reason:   Specialty Services Required    Requested Specialty:   Cardiology    Number of Visits Requested:   1  . POCT urinalysis dipstick    Meds ordered this encounter  Medications  . omeprazole (PRILOSEC) 20 MG capsule    Sig: Take 20 mg by mouth daily.  . carvedilol (COREG) 12.5 MG tablet    Sig: Take 1 tablet (12.5 mg total) by mouth 2 (two) times daily with a meal.    Dispense:  60 tablet    Refill:  1  . aspirin EC 81 MG tablet    Sig: Take 1 tablet (81 mg total) by mouth daily.  Marland Kitchen lisinopril-hydrochlorothiazide (PRINZIDE,ZESTORETIC) 10-12.5 MG tablet    Sig: Take 1 tablet by mouth daily.    Dispense:  30 tablet    Refill:  1  . cloNIDine (CATAPRES) tablet 0.1 mg   Over 40 min spent in face-to-face evaluation of and consultation with patient and coordination of care.  Over 50% of this time was spent counseling this patient.  Norberto Sorenson, M.D.  Primary Care at [facility] [address] [place], [place] [address] [phone] phone [phone] fax  11/20/16 10:48 AM

## 2016-10-27 ENCOUNTER — Ambulatory Visit: Payer: 59

## 2016-10-27 LAB — BASIC METABOLIC PANEL
BUN/Creatinine Ratio: 10 (ref 9–23)
BUN: 7 mg/dL (ref 6–20)
CALCIUM: 9.5 mg/dL (ref 8.7–10.2)
CHLORIDE: 101 mmol/L (ref 96–106)
CO2: 23 mmol/L (ref 18–29)
CREATININE: 0.67 mg/dL (ref 0.57–1.00)
GFR calc Af Amer: 134 mL/min/{1.73_m2} (ref >59)
GFR calc non Af Amer: 116 mL/min/{1.73_m2} (ref >59)
GLUCOSE: 89 mg/dL (ref 65–99)
Potassium: 4.1 mmol/L (ref 3.5–5.2)
Sodium: 138 mmol/L (ref 134–144)

## 2016-10-27 LAB — MICROALBUMIN / CREATININE URINE RATIO
CREATININE, UR: 59.9 mg/dL
MICROALB/CREAT RATIO: 27.5 mg/g{creat} (ref 0.0–30.0)
Microalbumin, Urine: 16.5 ug/mL

## 2016-10-28 ENCOUNTER — Ambulatory Visit: Payer: 59 | Admitting: Physician Assistant

## 2016-10-30 ENCOUNTER — Telehealth: Payer: Self-pay | Admitting: Family Medicine

## 2016-10-30 NOTE — Telephone Encounter (Signed)
PATIENT STATES SHE SAW DR. SHAW ON Monday FOR A FEW THINGS. SHE WAS AT WORK TODAY AND SHE RECEIVED A CALL THAT SHE THINKS WAS FROM [facility] SAYING THAT SHE NEEDS TO BE SCHEDULED FOR AN ULTRASOUND. SHE SAID SHE DOES NOT REMEMBER DR. SHAW SAYING ANYTHING TO HER ABOUT AN ULTRASOUND AND SHE WOULD LIKE TO KNOW WHY SHE NEEDS THIS? BEST PHONE [phone] (CELL) MBC

## 2016-10-30 NOTE — Telephone Encounter (Signed)
We talked during her visit to great extent about doing an evaluation to make sure there was nothing fixable that was increasing her blood pressure and that part of this was fully evaluating the kidneys inc in the blood flow to and from the kidneys which is done by ultrasound. I'm sorry that that wasn't clear.

## 2016-11-02 NOTE — Telephone Encounter (Signed)
Pt is scheduled to see cardiology soon and wonders if she can wait to do u/s until ? Due to cost, she cant afford both right now...please advise

## 2016-11-11 NOTE — Telephone Encounter (Signed)
She understands to make her cardiology appt 3/29

## 2016-11-12 ENCOUNTER — Ambulatory Visit (INDEPENDENT_AMBULATORY_CARE_PROVIDER_SITE_OTHER): Payer: 59 | Admitting: Family Medicine

## 2016-11-12 ENCOUNTER — Encounter: Payer: Self-pay | Admitting: Family Medicine

## 2016-11-12 VITALS — BP 154/100 | HR 120 | Temp 98.0°F | Resp 16 | Ht 64.0 in | Wt 178.0 lb

## 2016-11-12 DIAGNOSIS — I10 Essential (primary) hypertension: Secondary | ICD-10-CM | POA: Diagnosis not present

## 2016-11-12 DIAGNOSIS — R000 Tachycardia, unspecified: Secondary | ICD-10-CM

## 2016-11-12 DIAGNOSIS — J302 Other seasonal allergic rhinitis: Secondary | ICD-10-CM

## 2016-11-12 MED ORDER — FEXOFENADINE HCL 180 MG PO TABS: 180.0000 mg | ORAL_TABLET | Freq: Every day | ORAL | 1 refills | Status: DC

## 2016-11-12 MED ORDER — LISINOPRIL-HYDROCHLOROTHIAZIDE 20-12.5 MG PO TABS: 1.0000 | ORAL_TABLET | Freq: Every day | ORAL | 1 refills | Status: DC

## 2016-11-12 NOTE — Patient Instructions (Addendum)
   IF you received an x-ray today, you will receive an invoice from [facility]. Please contact  Radiology at [phone] with questions or concerns regarding your invoice.   IF you received labwork today, you will receive an invoice from [facility]. Please contact [facility] at [phone] with questions or concerns regarding your invoice.   Our billing staff will not be able to assist you with questions regarding bills from these companies.  You will be contacted with the lab results as soon as they are available. The fastest way to get your results is to activate your My Chart account. Instructions are located on the last page of this paperwork. If you have not heard from us regarding the results in 2 weeks, please contact this office.         Managing Your Hypertension Hypertension is commonly called high blood pressure. This is when the force of your blood pressing against the walls of your arteries is too strong. Arteries are blood vessels that carry blood from your heart throughout your body. Hypertension forces the heart to work harder to pump blood, and may cause the arteries to become narrow or stiff. Having untreated or uncontrolled hypertension can cause heart attack, stroke, kidney disease, and other problems. What are blood pressure readings? A blood pressure reading consists of a higher number over a lower number. Ideally, your blood pressure should be below 120/80. The first ("top") number is called the systolic pressure. It is a measure of the pressure in your arteries as your heart beats. The second ("bottom") number is called the diastolic pressure. It is a measure of the pressure in your arteries as the heart relaxes. What does my blood pressure reading mean? Blood pressure is classified into four stages. Based on your blood pressure reading, your health care provider may use the following stages to determine what type of treatment you need, if any.  Systolic pressure and diastolic pressure are measured in a unit called mm Hg. Normal   Systolic pressure: below 120.  Diastolic pressure: below 80. Elevated   Systolic pressure: 120-129.  Diastolic pressure: below 80. Hypertension stage 1     Diastolic pressure: 80-89. Hypertension stage 2   Systolic pressure: 140 or above.  Diastolic pressure: 90 or above. What health risks are associated with hypertension? Managing your hypertension is an important responsibility. Uncontrolled hypertension can lead to:  A heart attack.  A stroke.  A weakened blood vessel (aneurysm).  Heart failure.  Kidney damage.  Eye damage.  Metabolic syndrome.  Memory and concentration problems. What changes can I make to manage my hypertension? Eating and drinking   Eat a diet that is high in fiber and potassium, and low in salt (sodium), added sugar, and fat. An example eating plan is called the DASH (Dietary Approaches to Stop Hypertension) diet. To eat this way:  Eat plenty of fresh fruits and vegetables. Try to fill half of your plate at each meal with fruits and vegetables.  Eat whole grains, such as whole wheat pasta, brown rice, or whole grain bread. Fill about one quarter of your plate with whole grains.  Eat low-fat diary products.  Avoid fatty cuts of meat, processed or cured meats, and poultry with skin. Fill about one quarter of your plate with lean proteins such as fish, chicken without skin, beans, eggs, and tofu.  Avoid premade and processed foods. These tend to be higher in sodium, added sugar, and fat.     Lifestyle   Work   with your health care provider to maintain a healthy body weight, or to lose weight. Ask what an ideal weight is for you.  Get at least 30 minutes of exercise that causes your heart to beat faster (aerobic exercise) most days of the week. Activities may include walking, swimming, or biking.       Monitoring   Monitor your blood pressure  at home as told by your health care provider. Your personal target blood pressure may vary depending on your medical conditions, your age, and other factors.  Have your blood pressure checked regularly, as often as told by your health care provider. Working with your health care provider   Review all the medicines you take with your health care provider because there may be side effects or interactions.  Talk with your health care provider about your diet, exercise habits, and other lifestyle factors that may be contributing to hypertension.  Visit your health care provider regularly. Your health care provider can help you create and adjust your plan for managing hypertension. Will I need medicine to control my blood pressure? Your health care provider may prescribe medicine if lifestyle changes are not enough to get your blood pressure under control, and if:  Your systolic blood pressure is 130 or higher.  Your diastolic blood pressure is 80 or higher. Take medicines only as told by your health care provider. Follow the directions carefully. Blood pressure medicines must be taken as prescribed. The medicine does not work as well when you skip doses. Skipping doses also puts you at risk for problems. Contact a health care provider if:  You think you are having a reaction to medicines you have taken.  You have repeated (recurrent) headaches.  You feel dizzy.  You have swelling in your ankles.  You have trouble with your vision. Get help right away if:  You develop a severe headache or confusion.  You have unusual weakness or numbness, or you feel faint.  You have severe pain in your chest or abdomen.  You vomit repeatedly.  You have trouble breathing. Summary  Hypertension is when the force of blood pumping through your arteries is too strong. If this condition is not controlled, it may put you at risk for serious complications.  Your personal target blood pressure may vary  depending on your medical conditions, your age, and other factors. For most people, a normal blood pressure is less than 120/80.  Hypertension is managed by lifestyle changes, medicines, or both. Lifestyle changes include weight loss, eating a healthy, low-sodium diet, exercising more, and limiting alcohol. This information is not intended to replace advice given to you by your health care provider. Make sure you discuss any questions you have with your health care provider. Document Released: 05/04/2012 Document Revised: 07/08/2016 Document Reviewed: 07/08/2016 Elsevier Interactive Patient Education  2017 [employer] Inc.  

## 2016-11-12 NOTE — Progress Notes (Addendum)
Subjective:    Patient ID: Julie ChamberlainKimberlee D Lucas, female    DOB: 07/31/1981, 36 y.o.   MRN: [id number] Chief Complaint  Patient presents with  . Follow-up    bp check/ pt just took her BP medicine a hour ago    HPI  Ms. Julie Lucas is a delightful 36 yo woman here to f/u on her new diagnosis of stage III HTN of which she has a STRONG family hx. She was initially presented approximately 2 months ago for GERD type chest pain which started after eating pizza and worsened when supine. However her BP was 160/110 with a pulse in the 120s and a strong family history so patient was started on coreg 6.26 bid and verapamil 240 mg in addition to omeprazole with when necessary ranitidine.  On recheck 1 month later her vitals had not improved with blood pressure 177/120 and pulse of 130. She had been unable to tolerate the either medication due to fatigue and lightheadedness and reported her blood pressure outside the office was running 140-150 systolic and patient was trying to focus on therapeutic lifestyle change. Patient was encouraged to restart the verapamil daily and wean up to twice a day after 1 week. She presented for recheck 2 weeks later without any improvement in blood pressure though pulse had decreased to 100. This was my first visit with the patient and I had heard double the carvedilol to 12.5 twice a day As well as started lisinopril HCTZ 10-12.5 with instructions to increase this to 2 tabs of lisinopril HCTZ daily if blood pressure is still greater than 150/90 outside office one week later. I did treat her with a clonidine 0.1 mg once in the office as her blood pressure was 180s over 120s.  Due to the severity of the presentation at such a young age and woman with few other risk factors other than mild obesity with a BMI of 31 and a positive family history I advised patient that she should undergo a thorough evaluation for potential secondary causes of the malignant hypertension. I ordered an  ultrasound to evaluate for renal artery stenosis but patient was unable to afford this. I also referred her to cardiology for a second opinion and evaluation of her chronic sinus tachycardia. Her appointment is scheduled for next week. At some point it would likely be wise for patient to undergo a sleep study as well and potential nephrology referral though as it is very important for her to minimize medical costs at this time we will begin with rechecking her creatinine today after starting the ACE inhibitor 2 weeks prior.   Sometimes 1/2 c coffee in a.m. Or occ tea. Is her only caffiene Gym 2-3x/wk, feeling good.  Has lost 8-9 lbs in 6 wks!  She reports her home blood pressures have been running 128-145 over 90s with her pulse in the 90s. She reports she is always been tachycardic  Initially noted some bloating fatigue and increased urination when she started the lisinopril HCTZ though this has now all resolved Several leg cramps.  Eating cups of orange and grapefruit for K, drinking tons of water - will help relieve the leg cramps.  Stopped taking the xyzal for the past 2d.  She has been taking the flonase during the day. She started the xyzal from zyrtec in July - had no issues on zyrtec.   Patient has been on OCPs for 14 years due to menorrhagia. She is not currently in a relationship and not sexually active  with no plans to change that in the immediate future. I did advise patient that I would recommend doing a one month trial off of OCPs to see if there is any significant improvement in blood pressure but she has yet to do so. Certainly as long as patient is on lisinopril we will want to ensure she is on some form of birth control but I do think it would be safe to do a therapeutic trial to find out if these are contributing.  Depression screen [facility] 2/9 11/12/2016 10/26/2016 10/13/2016 09/05/2016 11/24/2015  Decreased Interest 0 0 0 0 0  Down, Depressed, Hopeless 0 0 0 0 0  PHQ - 2 Score 0 0 0 0  0   Past Medical History:  Diagnosis Date  . Allergic urticaria 03/05/2016  . Hypertension    Past Surgical History:  Procedure Laterality Date  . thumb surgery     Current Outpatient Prescriptions on File Prior to Visit  Medication Sig Dispense Refill  . aspirin EC 81 MG tablet Take 1 tablet (81 mg total) by mouth daily.    . carvedilol (COREG) 12.5 MG tablet Take 1 tablet (12.5 mg total) by mouth 2 (two) times daily with a meal. 60 tablet 1  . fluticasone (FLONASE) 50 MCG/ACT nasal spray Place 2 sprays into both nostrils daily as needed for allergies or rhinitis. 16 g 5  . levonorgestrel-ethinyl estradiol (SEASONALE,INTROVALE,JOLESSA) 0.15-0.03 MG tablet Take 1 tablet by mouth daily.    Marland Kitchen omeprazole (PRILOSEC) 20 MG capsule Take 20 mg by mouth daily.    . hydrocortisone 2.5 % ointment      No current facility-administered medications on file prior to visit.    Allergies  Allergen Reactions  . Augmentin [Amoxicillin-Pot Clavulanate] Nausea And Vomiting  . Sulfa Antibiotics Hives   Family History  Problem Relation Age of Onset  . Allergic rhinitis Mother   . Allergic rhinitis Father   . Angioedema Neg Hx   . Asthma Neg Hx   . Eczema Neg Hx   . Immunodeficiency Neg Hx   . Urticaria Neg Hx    Social History   Social History  . Marital status: Single    Spouse name: N/A  . Number of children: N/A  . Years of education: N/A   Social History Main Topics  . Smoking status: Never Smoker  . Smokeless tobacco: Never Used  . Alcohol use Yes     Comment: occasionally  . Drug use: No  . Sexual activity: Not Asked   Other Topics Concern  . None   Social History Narrative  . None     Review of Systems  Constitutional: Positive for activity change (increase) and fatigue. Negative for appetite change, chills, diaphoresis and fever.  HENT: Positive for congestion, postnasal drip, rhinorrhea and sneezing.   Eyes: Positive for itching. Negative for visual disturbance.    Respiratory: Negative for cough and shortness of breath.   Cardiovascular: Negative for chest pain, palpitations and leg swelling.  Genitourinary: Negative for decreased urine volume.  Neurological: Negative for syncope and headaches.  Hematological: Does not bruise/bleed easily.       Objective:   Physical Exam  Constitutional: She is oriented to person, place, and time. She appears well-developed and well-nourished. No distress.  HENT:  Head: Normocephalic and atraumatic.  Right Ear: External ear normal.  Left Ear: External ear normal.  Eyes: Conjunctivae are normal. No scleral icterus.  Neck: Normal range of motion. Neck supple. No thyromegaly present.  Cardiovascular: Regular rhythm, normal heart sounds and intact distal pulses.  Tachycardia present.   Pulmonary/Chest: Effort normal and breath sounds normal. No respiratory distress.  Musculoskeletal: She exhibits no edema.  Lymphadenopathy:    She has no cervical adenopathy.  Neurological: She is alert and oriented to person, place, and time.  Skin: Skin is warm and dry. She is not diaphoretic. No erythema.  Psychiatric: She has a normal mood and affect. Her behavior is normal.    BP (!) 154/100   Pulse (!) 120   Temp 98 F (36.7 C) (Oral)   Resp 16   Ht 5\' 4"  (1.626 m)   Wt 178 lb (80.7 kg)   SpO2 100%   BMI 30.55 kg/m    BP (now about 2 hrs after pt took BP meds) 148/100 manual, sitting, med cuff Assessment & Plan:    1. Chronic tachycardia - has appt with cards in sev wks  2. Essential hypertension - increase lisinopril-hctz from 10-12.5 to 20-12.5.  Cont carvedilol 12.5 bid. Cont to check BP at home and if still elev in 3-4 wks, if still not at goal, will have her go up to 20-25.  Pt has cardiology appt in sev wks and so can recheck/adjust meds then. They will likely recheck her bmp.  3. Acute seasonal allergic rhinitis, unspecified trigger - If allegra not working, call for azelastine nasal spray or  singulair. Palpitations with xyzal, sudafed contraindicated.      Meds ordered this encounter  Medications  . lisinopril-hydrochlorothiazide (ZESTORETIC) 20-12.5 MG tablet    Sig: Take 1 tablet by mouth daily.    Dispense:  30 tablet    Refill:  1  . fexofenadine (ALLEGRA) 180 MG tablet    Sig: Take 1 tablet (180 mg total) by mouth daily.    Dispense:  90 tablet    Refill:  1     Norberto Sorenson, M.D.  Primary Care at [facility] [address] [place], [place] [address] [phone] phone [phone] fax  11/14/16 2:29 AM

## 2016-11-19 ENCOUNTER — Ambulatory Visit (INDEPENDENT_AMBULATORY_CARE_PROVIDER_SITE_OTHER): Payer: 59 | Admitting: Cardiovascular Disease

## 2016-11-19 ENCOUNTER — Encounter: Payer: Self-pay | Admitting: Cardiovascular Disease

## 2016-11-19 VITALS — BP 162/98 | HR 95 | Ht 64.0 in | Wt 180.2 lb

## 2016-11-19 DIAGNOSIS — R000 Tachycardia, unspecified: Secondary | ICD-10-CM | POA: Diagnosis not present

## 2016-11-19 DIAGNOSIS — I10 Essential (primary) hypertension: Secondary | ICD-10-CM | POA: Diagnosis not present

## 2016-11-19 DIAGNOSIS — I4711 Inappropriate sinus tachycardia, so stated: Secondary | ICD-10-CM

## 2016-11-19 HISTORY — DX: Tachycardia, unspecified: R00.0

## 2016-11-19 HISTORY — DX: Essential (primary) hypertension: I10

## 2016-11-19 HISTORY — DX: Inappropriate sinus tachycardia, so stated: I47.11

## 2016-11-19 MED ORDER — LISINOPRIL-HYDROCHLOROTHIAZIDE 20-25 MG PO TABS: 1.0000 | ORAL_TABLET | Freq: Every day | ORAL | 5 refills | Status: DC

## 2016-11-19 NOTE — Progress Notes (Signed)
Cardiology Office Note   Date:  11/19/2016   ID:  Julie Lucas, DOB 04/08/1981, MRN [id number]  PCP:  Julie PlattStephanie English, PA  Cardiologist:   Julie Siiffany [place], MD   Chief Complaint  Patient presents with  . New Patient (Initial Visit)    Pt states no Sx.       History of Present Illness: Julie Lucas is a 36 y.o. female with hypertension who presents for an evaluation of tachycardia.  She Was diagnosed with hypertension 09/05/16.  She was also noted to have chronic tachycardia. She was started on carvedilol without improvement in her blood pressure.  Carvedilol was increased and HCTZ/lisinopril was added to her regimen.  She last saw Dr. Norberto SorensonEva Shaw on 11/12/16 and was referred to cardiology for evaluation of tachycardia. Her EKG at that time showed sinus tachycardia with a heart rate of 118. At that appointment she has been feeling well. She checks her blood pressures at home and they have been in the 130s over 80s to 90s.  She denies chest pain or shortness of breath. She also hasn't noted palpitations. She notes that her heart rate has always been fast. She had laboratory testing with Dr. Clelia CroftShaw that revealed normal thyroid function, blood counts, and electrolytes.  She initially noted fatigue after starting carvedilol at this has improved. She drinks very little caffeine and does not use over-the-counter cold or cough medications. She reports that she is feeling very anxious about her appointment today.   Past Medical History:  Diagnosis Date  . Allergic urticaria 03/05/2016  . Essential hypertension 11/19/2016  . Hypertension   . Inappropriate sinus tachycardia 11/19/2016    Past Surgical History:  Procedure Laterality Date  . thumb surgery       Current Outpatient Prescriptions  Medication Sig Dispense Refill  . aspirin EC 81 MG tablet Take 1 tablet (81 mg total) by mouth daily.    . carvedilol (COREG) 12.5 MG tablet Take 1 tablet (12.5 mg total) by mouth 2 (two)  times daily with a meal. 60 tablet 1  . fexofenadine (ALLEGRA) 180 MG tablet Take 1 tablet (180 mg total) by mouth daily. 90 tablet 1  . fluticasone (FLONASE) 50 MCG/ACT nasal spray Place 2 sprays into both nostrils daily as needed for allergies or rhinitis. 16 g 5  . hydrocortisone 2.5 % ointment     . levonorgestrel-ethinyl estradiol (SEASONALE,INTROVALE,JOLESSA) 0.15-0.03 MG tablet Take 1 tablet by mouth daily.    Marland Kitchen. omeprazole (PRILOSEC) 20 MG capsule Take 20 mg by mouth daily.    Marland Kitchen. lisinopril-hydrochlorothiazide (PRINZIDE,ZESTORETIC) 20-25 MG tablet Take 1 tablet by mouth daily. 30 tablet 5   No current facility-administered medications for this visit.     Allergies:   Augmentin [amoxicillin-pot clavulanate] and Sulfa antibiotics    Social History:  The patient  reports that she has never smoked. She has never used smokeless tobacco. She reports that she drinks alcohol. She reports that she does not use drugs.   Family History:  The patient's family history includes Allergic rhinitis in her father and mother; Diabetes in her maternal grandfather and paternal grandfather; Hypertension in her brother, father, and mother.    ROS:  Please see the history of present illness.   Otherwise, review of systems are positive for none.   All other systems are reviewed and negative.    PHYSICAL EXAM: VS:  BP (!) 162/98   Pulse 95   Ht 5\' 4"  (1.626 m)   Wt 81.7  kg (180 lb 3.2 oz)   BMI 30.93 kg/m  , BMI Body mass index is 30.93 kg/m. GENERAL:  Well appearing HEENT:  Pupils equal round and reactive, fundi not visualized, oral mucosa unremarkable NECK:  No jugular venous distention, waveform within normal limits, carotid upstroke brisk and symmetric, no bruits, no thyromegaly LYMPHATICS:  No cervical adenopathy LUNGS:  Clear to auscultation bilaterally HEART:  RRR.  PMI not displaced or sustained,S1 and S2 within normal limits, no S3, no S4, no clicks, no rubs, no murmurs ABD:  Flat, positive  bowel sounds normal in frequency in pitch, no bruits, no rebound, no guarding, no midline pulsatile mass, no hepatomegaly, no splenomegaly EXT:  2 plus pulses throughout, no edema, no cyanosis no clubbing SKIN:  No rashes no nodules NEURO:  Cranial nerves II through XII grossly intact, motor grossly intact throughout PSYCH:  Cognitively intact, oriented to person place and time   EKG:  EKG is not ordered today. The ekg ordered today demonstrates sinus tachycardia rate 118 bpm.     Recent Labs: 09/05/2016: ALT 15; Hemoglobin 13.8; TSH 0.954 10/26/2016: BUN 7; Creatinine, Ser 0.67; Potassium 4.1; Sodium 138    Lipid Panel No results found for: CHOL, TRIG, HDL, CHOLHDL, VLDL, LDLCALC, LDLDIRECT    Wt Readings from Last 3 Encounters:  11/19/16 81.7 kg (180 lb 3.2 oz)  11/12/16 80.7 kg (178 lb)  10/26/16 82.1 kg (181 lb)      ASSESSMENT AND PLAN:  # Hypertension: Blood pressure has been better-controlled but still not at goal (<130/80).  We will increase lisinopril/HCTZ to 20/25mg  daily. Check CMP in 1 week.  Continue carvedilol 12.5mg  bid.  Given that her hypertension is new and is requiring several medications to control, we will get renal artery Dopplers.  # Sinus tachycardia: Heart rates are better-controlled on carvedilol.  She admits to being anxious about medical appointments and stressed about work.  Thyroid function, blood counts, and electrolytes were within normal limits. 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 Continue carvedilol.    # CV Disease Prevention: Check lipids in 1 week.  Based on that we will determine if she needs to continue aspirin.   Current medicines are reviewed at length with the patient today.  The patient does not have concerns regarding medicines.  The following changes have been made:  Lisinopril 20mg  , hctz 25 mg  Labs/ tests ordered today include:  No orders of the defined types were placed in  this encounter.    Disposition:   FU with Catherin Doorn C. Duke Salvia, MD, [facility] in 6 months.  Pharmacy in 2 weeks.     This note was written with the assistance of speech recognition software.  Please excuse any transcriptional errors.  Signed, Teralyn Mullins C. Duke Salvia, MD, [facility]  11/19/2016 4:49 PM    [facility]

## 2016-11-19 NOTE — Patient Instructions (Signed)
Medication Instructions:  INCREASE YOUR LISINOPRIL HCT 20/25 MG DAILY  Labwork: NONE  Testing/Procedures: Your physician has requested that you have a renal artery duplex. During this test, an ultrasound is used to evaluate blood flow to the kidneys. Allow one hour for this exam. Do not eat after midnight the day before and avoid carbonated beverages. Take your medications as you usually do.  Follow-Up: Your physician recommends that you schedule a follow-up appointment in: PHARM D 2 WEEKS  Your physician wants you to follow-up in: 6 MONTH OV  You will receive a reminder letter in the mail two months in advance. If you don't receive a letter, please call our office to schedule the follow-up appointment.  If you need a refill on your cardiac medications before your next appointment, please call your pharmacy.

## 2016-11-20 NOTE — Addendum Note (Signed)
Addended by: Norberto Sorenson on: 11/20/2016 10:46 AM   Modules accepted: Orders

## 2016-11-24 ENCOUNTER — Telehealth: Payer: Self-pay | Admitting: Cardiovascular Disease

## 2016-11-24 NOTE — Telephone Encounter (Signed)
Recommendation:  1. Take medication with food or snack  2. Change to take lisinopril/HCTZ  1/2 tablet in AM and 1/2 in evening.  BP of 136/91 not low but may take her few days to build tolerability to new dose.  3. Monitor BP twice daily for 2 weeks and call back if hypotension or problems continue

## 2016-11-24 NOTE — Telephone Encounter (Signed)
° °  Pt c/o medication issue:  1. Name of Medication: lisinopril 20-25mg    &    crvedill 12.5  2. How are you currently taking this medication (dosage and times per day)? Unknown  3. Are you having a reaction (difficulty breathing--STAT)? no 4. What is your medication issue? Nausea and light headed   136/91

## 2016-11-24 NOTE — Telephone Encounter (Signed)
Spoke with pt she states that she increased her medications as ordered started last Friday and ever since Saturday intermittently she has been lightheaded and nauseous. She increased LISINOPRIL HCT 20/25 MG DAILY and takes carvedilol 12.5mg  BID.and all her medications as ordered. She states that she thinks this is from the lisinopril. What should she do?

## 2016-11-24 NOTE — Telephone Encounter (Signed)
Pt notified-she will try and let us know how it goes

## 2016-11-25 ENCOUNTER — Telehealth: Payer: Self-pay | Admitting: Cardiovascular Disease

## 2016-11-25 DIAGNOSIS — E785 Hyperlipidemia, unspecified: Secondary | ICD-10-CM

## 2016-11-25 NOTE — Telephone Encounter (Signed)
Ms.Schatz is calling to see if she can hold off on the renal ultrasound for about a month or do she need to get it done ASAP . Please call

## 2016-11-25 NOTE — Telephone Encounter (Signed)
Pt unreachable when called, goes to VM. Left msg to inform her I would route inquiry for Dr. Duke Salvia to review.

## 2016-11-26 NOTE — Telephone Encounter (Signed)
OK to wait until convenient for her.

## 2016-11-26 NOTE — Telephone Encounter (Signed)
Returned call, patient advised OK to wait on imaging - she voiced understanding and thanks.  She noted a second concern, wanted to see if she could get orders for cholesterol to be drawn same day as her BP check in office (4/12) - last OV notes indicate no labs ordered but there is mention of lipid study. Will verify w Dr. Duke Salvia if OK to order, and any other needed labs.

## 2016-11-27 NOTE — Telephone Encounter (Signed)
Spoke with pt, aware orders are placed and she should come fasting to that appointment.

## 2016-11-27 NOTE — Telephone Encounter (Signed)
That would be great.  Please check fasting lipids.

## 2016-12-01 ENCOUNTER — Telehealth: Payer: Self-pay | Admitting: Cardiovascular Disease

## 2016-12-01 NOTE — Telephone Encounter (Signed)
OK to keep taking half HCTZ-lisinopril.  Continue to monitor BP.  If her BP is >120/80, take the full dose.

## 2016-12-01 NOTE — Telephone Encounter (Signed)
Returned call to patient-patient states her blood pressure was running lower last night (114/76) before her evening meds and was unsure whether to take full dose of evening BP medication.  Patient reports she took half of a lisinopril-hctz last PM.  Reports BP this morning 122/80, wondering if she should take half or full dose of medication this morning.  Denies dizziness, lightheadedness, or any other complaints.   Reports she is taking 1/2 tablet of Coreg (med list states 1 tablet 12.5mg  BID).    Advised to continue current regimen as prescribed and continue to monitor.  Advised okay to hold if systolic <90 (per protocol) or symptomatic.  BP check on 4/12 with CVRR.

## 2016-12-01 NOTE — Telephone Encounter (Signed)
New Message     Pt c/o BP issue: STAT if pt c/o blurred vision, one-sided weakness or slurred speech  1. What are your last 5 BP readings? 6p 114/76  945p 110/77   2. Are you having any other symptoms (ex. Dizziness, headache, blurred vision, passed out)? no  3. What is your BP issue? Her bp has dropped before she took her medication

## 2016-12-01 NOTE — Telephone Encounter (Signed)
Advised patient and she will continue to monitor and keep appointment as scheduled Thursday am. Did advise if systolic less than 90 or symptomatic ok to hold (per protocol)

## 2016-12-03 ENCOUNTER — Ambulatory Visit (INDEPENDENT_AMBULATORY_CARE_PROVIDER_SITE_OTHER): Payer: 59 | Admitting: Pharmacist

## 2016-12-03 VITALS — BP 132/88 | HR 88

## 2016-12-03 DIAGNOSIS — I10 Essential (primary) hypertension: Secondary | ICD-10-CM

## 2016-12-03 NOTE — Patient Instructions (Addendum)
Return for a  follow up appointment in as needed  Your blood pressure today is 132/88 pulse 86  Check your blood pressure at home daily (if able) and keep record of the readings.  Take your BP meds as follows: Carvedilol 12.5mg  twice daily Lisinopril-HCTZ 20-25 daily (1/2 tablet in AM and 1/2 tablet in PM)  Bring all of your meds, your BP cuff and your record of home blood pressures to your next appointment.  Exercise as you're able, try to walk approximately 30 minutes per day.  Keep salt intake to a minimum, especially watch canned and prepared boxed foods.  Eat more fresh fruits and vegetables and fewer canned items.  Avoid eating in fast food restaurants.    HOW TO TAKE YOUR BLOOD PRESSURE: . Rest 5 minutes before taking your blood pressure. .  Don't smoke or drink caffeinated beverages for at least 30 minutes before. . Take your blood pressure before (not after) you eat. . Sit comfortably with your back supported and both feet on the floor (don't cross your legs). . Elevate your arm to heart level on a table or a desk. . Use the proper sized cuff. It should fit smoothly and snugly around your bare upper arm. There should be enough room to slip a fingertip under the cuff. The bottom edge of the cuff should be 1 inch above the crease of the elbow. . Ideally, take 3 measurements at one sitting and record the average.

## 2016-12-03 NOTE — Progress Notes (Signed)
Patient ID: Julie Lucas                 DOB: Oct 24, 1980                      MRN: [id number]     HPI: Julie Lucas is a 36 y.o. female referred by Dr. Duke Salvia to HTN clinic.  PMH includes hypertension and sinus tachycardia.  Carvedilol was initiated without improvement on her BP readings, then lisinopril/HTCZ was added to therapy.  Noted strong family history of HTN including mother, father, and brother.  Mother is a Engineer, civil (consulting) and is very aware of need to keep her BP under control. Patient presents today for HTN evaluation and medication management.   Denies headaches, dizziness, swelling, or palpitations.  Current HTN meds:  Carvedilol 12.5mg  twice daily Lisinopril-HCTZ 20-25 daily (1/2 tab in AM and 1/2 tab in PM)  Previously tried:  Verapamil  daily  BP goal: <130/80  Family History:  Strong family history for hypertension with mother, father and brother currently on therapy for hypertension  Social History: works at a [employer]. Denies alcohol or tobacco use  Diet: significant decrease in sodium intake and take-out since January/2018.   Exercise: gym 2-3x week mainly cardio with some weights  Home BP readings: 12 readings: 121/83 average; (range 109-136/74-91); pulse 78-96 **[facility] BP [facility] Readings from Last 3 Encounters:  11/19/16 180 lb 3.2 oz (81.7 kg)  11/12/16 178 lb (80.7 kg)  10/26/16 181 lb (82.1 kg)   BP Readings from Last 3 Encounters:  12/03/16 132/88  11/19/16 (!) 162/98  11/12/16 (!) 154/100   Pulse Readings from Last 3 Encounters:  12/03/16 88  11/19/16 95  11/12/16 (!) 120    Past Medical History:  Diagnosis Date  . Allergic urticaria 03/05/2016  . Essential hypertension 11/19/2016  . Hypertension   . Inappropriate sinus tachycardia 11/19/2016    Current Outpatient Prescriptions on File Prior to Visit  Medication Sig Dispense Refill  . carvedilol (COREG)  12.5 MG tablet Take 1 tablet (12.5 mg total) by mouth 2 (two) times daily with a meal. 60 tablet 1  . fexofenadine (ALLEGRA) 180 MG tablet Take 1 tablet (180 mg total) by mouth daily. 90 tablet 1  . fluticasone (FLONASE) 50 MCG/ACT nasal spray Place 2 sprays into both nostrils daily as needed for allergies or rhinitis. 16 g 5  . levonorgestrel-ethinyl estradiol (SEASONALE,INTROVALE,JOLESSA) 0.15-0.03 MG tablet Take 1 tablet by mouth daily.    Marland Kitchen lisinopril-hydrochlorothiazide (PRINZIDE,ZESTORETIC) 20-25 MG tablet Take 1 tablet by mouth daily. 30 tablet 5  . omeprazole (PRILOSEC) 20 MG capsule Take 20 mg by mouth daily.     No current facility-administered medications on file prior to visit.     Allergies  Allergen Reactions  . Augmentin [Amoxicillin-Pot Clavulanate] Nausea And Vomiting  . Sulfa Antibiotics Hives    Blood pressure 132/88, pulse 88, SpO2 99 %.  Essential hypertension:  Blood pressure today is at goal and patient denies ADRs or intolerance to current therapy.  Home BP readings also show good control with an average of 121/83.  Patient working on lifestyle modifications including low sodium intake and increase physical activity.  Also discussed ACEi/ARB pregnancy risks and the need to change therapy if patient gets pregnant or plan to get pregnant in the future.  Will continue current therapy without changes and follow up in clinic as needed. Patient was also  encouraged to call if BP becomes elevated again or if adverse reaction noted.   Rector Devonshire Rodriguez-Guzman PharmD, BCPS [facility] [address] [place] [address] 12/03/2016 8:53 AM

## 2016-12-31 ENCOUNTER — Other Ambulatory Visit: Payer: Self-pay | Admitting: Family Medicine

## 2017-01-01 NOTE — Telephone Encounter (Signed)
Patient has seen cardiology twice and last BP was under excellent control. Her regimen was not changed her last visit. She is monitoring her blood pressure at home and they have all been at goal. She has follow-up appointment with me scheduled 02/15/2017 - 6 wks

## 2017-01-14 ENCOUNTER — Encounter: Payer: Self-pay | Admitting: Family Medicine

## 2017-01-19 ENCOUNTER — Encounter: Payer: Self-pay | Admitting: Family Medicine

## 2017-02-15 ENCOUNTER — Encounter: Payer: Self-pay | Admitting: Family Medicine

## 2017-02-15 ENCOUNTER — Ambulatory Visit (INDEPENDENT_AMBULATORY_CARE_PROVIDER_SITE_OTHER): Payer: 59 | Admitting: Family Medicine

## 2017-02-15 VITALS — BP 135/91 | HR 99 | Temp 98.1°F | Resp 18 | Ht 64.0 in | Wt 177.0 lb

## 2017-02-15 DIAGNOSIS — R05 Cough: Secondary | ICD-10-CM | POA: Diagnosis not present

## 2017-02-15 DIAGNOSIS — R059 Cough, unspecified: Secondary | ICD-10-CM

## 2017-02-15 DIAGNOSIS — I10 Essential (primary) hypertension: Secondary | ICD-10-CM

## 2017-02-15 DIAGNOSIS — J3089 Other allergic rhinitis: Secondary | ICD-10-CM

## 2017-02-15 DIAGNOSIS — J0100 Acute maxillary sinusitis, unspecified: Secondary | ICD-10-CM

## 2017-02-15 DIAGNOSIS — Z5181 Encounter for therapeutic drug level monitoring: Secondary | ICD-10-CM

## 2017-02-15 MED ORDER — AZELASTINE HCL 0.15 % NA SOLN: 1.0000 | Freq: Two times a day (BID) | NASAL | 1 refills | Status: DC

## 2017-02-15 MED ORDER — AZITHROMYCIN 250 MG PO TABS: ORAL_TABLET | ORAL | 0 refills | Status: DC

## 2017-02-15 NOTE — Patient Instructions (Addendum)
   IF you received an x-ray today, you will receive an invoice from [facility]. Please contact [facility] at [phone] with questions or concerns regarding your invoice.   IF you received labwork today, you will receive an invoice from [facility]. Please contact [facility] at [phone] with questions or concerns regarding your invoice.   Our billing staff will not be able to assist you with questions regarding bills from these companies.  You will be contacted with the lab results as soon as they are available. The fastest way to get your results is to activate your My Chart account. Instructions are located on the last page of this paperwork. If you have not heard from us regarding the results in 2 weeks, please contact this office.      Managing Your Hypertension Hypertension is commonly called high blood pressure. This is when the force of your blood pressing against the walls of your arteries is too strong. Arteries are blood vessels that carry blood from your heart throughout your body. Hypertension forces the heart to work harder to pump blood, and may cause the arteries to become narrow or stiff. Having untreated or uncontrolled hypertension can cause heart attack, stroke, kidney disease, and other problems. What are blood pressure readings? A blood pressure reading consists of a higher number over a lower number. Ideally, your blood pressure should be below 120/80. The first ("top") number is called the systolic pressure. It is a measure of the pressure in your arteries as your heart beats. The second ("bottom") number is called the diastolic pressure. It is a measure of the pressure in your arteries as the heart relaxes. What does my blood pressure reading mean? Blood pressure is classified into four stages. Based on your blood pressure reading, your health care provider may use the following stages to determine what type of treatment you need, if any. Systolic  pressure and diastolic pressure are measured in a unit called mm Hg. Normal  Systolic pressure: below 120.  Diastolic pressure: below 80. Elevated  Systolic pressure: 120-129.  Diastolic pressure: below 80. Hypertension stage 1  Systolic pressure: 130-139.  Diastolic pressure: 80-89. Hypertension stage 2  Systolic pressure: 140 or above.  Diastolic pressure: 90 or above. What health risks are associated with hypertension? Managing your hypertension is an important responsibility. Uncontrolled hypertension can lead to:  A heart attack.  A stroke.  A weakened blood vessel (aneurysm).  Heart failure.  Kidney damage.  Eye damage.  Metabolic syndrome.  Memory and concentration problems.  What changes can I make to manage my hypertension? Hypertension can be managed by making lifestyle changes and possibly by taking medicines. Your health care provider will help you make a plan to bring your blood pressure within a normal range. Eating and drinking  Eat a diet that is high in fiber and potassium, and low in salt (sodium), added sugar, and fat. An example eating plan is called the DASH (Dietary Approaches to Stop Hypertension) diet. To eat this way: ? Eat plenty of fresh fruits and vegetables. Try to fill half of your plate at each meal with fruits and vegetables. ? Eat whole grains, such as whole wheat pasta, brown rice, or whole grain bread. Fill about one quarter of your plate with whole grains. ? Eat low-fat diary products. ? Avoid fatty cuts of meat, processed or cured meats, and poultry with skin. Fill about one quarter of your plate with lean proteins such as fish, chicken without skin, beans, eggs,   and tofu. ? Avoid premade and processed foods. These tend to be higher in sodium, added sugar, and fat.  Reduce your daily sodium intake. Most people with hypertension should eat less than 1,500 mg of sodium a day.  Limit alcohol intake to no more than 1 drink a day  for nonpregnant women and 2 drinks a day for men. One drink equals 12 oz of beer, 5 oz of wine, or 1 oz of hard liquor. Lifestyle  Work with your health care provider to maintain a healthy body weight, or to lose weight. Ask what an ideal weight is for you.  Get at least 30 minutes of exercise that causes your heart to beat faster (aerobic exercise) most days of the week. Activities may include walking, swimming, or biking.  Include exercise to strengthen your muscles (resistance exercise), such as weight lifting, as part of your weekly exercise routine. Try to do these types of exercises for 30 minutes at least 3 days a week.  Do not use any products that contain nicotine or tobacco, such as cigarettes and e-cigarettes. If you need help quitting, ask your health care provider.  Control any long-term (chronic) conditions you have, such as high cholesterol or diabetes. Monitoring  Monitor your blood pressure at home as told by your health care provider. Your personal target blood pressure may vary depending on your medical conditions, your age, and other factors.  Have your blood pressure checked regularly, as often as told by your health care provider. Working with your health care provider  Review all the medicines you take with your health care provider because there may be side effects or interactions.  Talk with your health care provider about your diet, exercise habits, and other lifestyle factors that may be contributing to hypertension.  Visit your health care provider regularly. Your health care provider can help you create and adjust your plan for managing hypertension. Will I need medicine to control my blood pressure? Your health care provider may prescribe medicine if lifestyle changes are not enough to get your blood pressure under control, and if:  Your systolic blood pressure is 130 or higher.  Your diastolic blood pressure is 80 or higher.  Take medicines only as told  by your health care provider. Follow the directions carefully. Blood pressure medicines must be taken as prescribed. The medicine does not work as well when you skip doses. Skipping doses also puts you at risk for problems. Contact a health care provider if:  You think you are having a reaction to medicines you have taken.  You have repeated (recurrent) headaches.  You feel dizzy.  You have swelling in your ankles.  You have trouble with your vision. Get help right away if:  You develop a severe headache or confusion.  You have unusual weakness or numbness, or you feel faint.  You have severe pain in your chest or abdomen.  You vomit repeatedly.  You have trouble breathing. Summary  Hypertension is when the force of blood pumping through your arteries is too strong. If this condition is not controlled, it may put you at risk for serious complications.  Your personal target blood pressure may vary depending on your medical conditions, your age, and other factors. For most people, a normal blood pressure is less than 120/80.  Hypertension is managed by lifestyle changes, medicines, or both. Lifestyle changes include weight loss, eating a healthy, low-sodium diet, exercising more, and limiting alcohol. This information is not intended to replace advice   given to you by your health care provider. Make sure you discuss any questions you have with your health care provider. Document Released: 05/04/2012 Document Revised: 07/08/2016 Document Reviewed: 07/08/2016 Elsevier Interactive Patient Education  2018 Elsevier Inc.  

## 2017-02-15 NOTE — Progress Notes (Signed)
Subjective:    Patient ID: Julie Lucas, female    DOB: 12/06/1980, 36 y.o.   MRN: [id number] Chief Complaint  Patient presents with  . Hypertension  . Follow-up    HPI  Still w/ dry cough at nighty.   Has been having bad sinus pressure and drainage.  Had an eye infection and was treated with antibiotic drops.   Both hand occ goes numbness at night. - primariy 4th and 5th but can shake out and gho back to sleep.   Past Medical History:  Diagnosis Date  . Allergic urticaria 03/05/2016  . Essential hypertension 11/19/2016  . Hypertension   . Inappropriate sinus tachycardia 11/19/2016   Past Surgical History:  Procedure Laterality Date  . thumb surgery     Current Outpatient Prescriptions on File Prior to Visit  Medication Sig Dispense Refill  . carvedilol (COREG) 12.5 MG tablet TAKE 1 TABLET TWICE A DAY WITH A MEAL 180 tablet 1  . fluticasone (FLONASE) 50 MCG/ACT nasal spray Place 2 sprays into both nostrils daily as needed for allergies or rhinitis. 16 g 5  . levonorgestrel-ethinyl estradiol (SEASONALE,INTROVALE,JOLESSA) 0.15-0.03 MG tablet Take 1 tablet by mouth daily.    Marland Kitchen lisinopril-hydrochlorothiazide (PRINZIDE,ZESTORETIC) 20-25 MG tablet Take 1 tablet by mouth daily. 30 tablet 5  . omeprazole (PRILOSEC) 20 MG capsule Take 20 mg by mouth daily.     No current facility-administered medications on file prior to visit.    Allergies  Allergen Reactions  . Augmentin [Amoxicillin-Pot Clavulanate] Nausea And Vomiting  . Sulfa Antibiotics Hives   Family History  Problem Relation Age of Onset  . Allergic rhinitis Mother   . Hypertension Mother   . Allergic rhinitis Father   . Hypertension Father   . Hypertension Brother   . Diabetes Maternal Grandfather   . Diabetes Paternal Grandfather   . Angioedema Neg Hx   . Asthma Neg Hx   . Eczema Neg Hx   . Immunodeficiency Neg Hx   . Urticaria Neg Hx    Social History   Social History  . Marital status: Single   Spouse name: N/A  . Number of children: N/A  . Years of education: N/A   Social History Main Topics  . Smoking status: Never Smoker  . Smokeless tobacco: Never Used  . Alcohol use Yes     Comment: occasionally  . Drug use: No  . Sexual activity: Not Asked   Other Topics Concern  . None   Social History Narrative  . None   Depression screen [facility] 2/9 02/15/2017 11/12/2016 10/26/2016 10/13/2016 09/05/2016  Decreased Interest 0 0 0 0 0  Down, Depressed, Hopeless 0 0 0 0 0  PHQ - 2 Score 0 0 0 0 0    Review of Systems See hpi    Objective:   Physical Exam  Constitutional: She is oriented to person, place, and time. She appears well-developed and well-nourished. She appears lethargic. She appears ill. No distress.  HENT:  Head: Normocephalic and atraumatic.  Right Ear: External ear and ear canal normal. Tympanic membrane is retracted. A middle ear effusion is present.  Left Ear: External ear and ear canal normal. Tympanic membrane is retracted. A middle ear effusion is present.  Nose: Mucosal edema and rhinorrhea present. Right sinus exhibits maxillary sinus tenderness. Left sinus exhibits maxillary sinus tenderness.  Mouth/Throat: Uvula is midline and mucous membranes are normal. Posterior oropharyngeal erythema present. No oropharyngeal exudate, posterior oropharyngeal edema or tonsillar abscesses.  Eyes: Conjunctivae  are normal. Right eye exhibits no discharge. Left eye exhibits no discharge. No scleral icterus.  Neck: Normal range of motion. Neck supple.  Cardiovascular: Normal rate, regular rhythm, normal heart sounds and intact distal pulses.   Pulmonary/Chest: Effort normal and breath sounds normal.  Lymphadenopathy:       Head (right side): Submandibular adenopathy present. No preauricular and no posterior auricular adenopathy present.       Head (left side): Submandibular adenopathy present. No preauricular and no posterior auricular adenopathy present.    She has no cervical  adenopathy.       Right: No supraclavicular adenopathy present.       Left: No supraclavicular adenopathy present.  Neurological: She is oriented to person, place, and time. She appears lethargic.  Skin: Skin is warm and dry. She is not diaphoretic. No erythema.  Psychiatric: She has a normal mood and affect. Her behavior is normal.     BP (!) 135/91   Pulse 99   Temp 98.1 F (36.7 C) (Oral)   Resp 18   Ht 5\' 4"  (1.626 m)   Wt 177 lb (80.3 kg)   SpO2 100%   BMI 30.38 kg/m   Assessment & Plan:   1. Acute non-recurrent maxillary sinusitis   2. Medication monitoring encounter   3. Essential hypertension   4. Allergic rhinitis   5. Intermittent coughing     Orders Placed This Encounter  Procedures  . CBC with Differential/Platelet  . Basic metabolic panel    Order Specific Question:   Has the patient fasted?    Answer:   No    Meds ordered this encounter  Medications  . azithromycin (ZITHROMAX) 250 MG tablet    Sig: Take 2 tabs PO x 1 dose, then 1 tab PO QD x 4 days    Dispense:  6 tablet    Refill:  0  . Azelastine HCl 0.15 % SOLN    Sig: Place 1 spray into the nose 2 (two) times daily.    Dispense:  30 mL    Refill:  1     Norberto SorensonEva Beyonce Sawatzky, M.D.  Primary [facility] at [facility]  [place] [address] [place], [place] [address] [phone] phone [phone] fax  02/17/17 10:54 PM

## 2017-02-16 LAB — CBC WITH DIFFERENTIAL/PLATELET
BASOS ABS: 0 10*3/uL (ref 0.0–0.2)
Basos: 0 %
EOS (ABSOLUTE): 0.1 10*3/uL (ref 0.0–0.4)
EOS: 2 %
HEMATOCRIT: 39 % (ref 34.0–46.6)
HEMOGLOBIN: 12.9 g/dL (ref 11.1–15.9)
IMMATURE GRANS (ABS): 0 10*3/uL (ref 0.0–0.1)
Immature Granulocytes: 0 %
LYMPHS: 42 %
Lymphocytes Absolute: 2.4 10*3/uL (ref 0.7–3.1)
MCH: 29 pg (ref 26.6–33.0)
MCHC: 33.1 g/dL (ref 31.5–35.7)
MCV: 88 fL (ref 79–97)
MONOCYTES: 10 %
Monocytes Absolute: 0.6 10*3/uL (ref 0.1–0.9)
NEUTROS ABS: 2.6 10*3/uL (ref 1.4–7.0)
Neutrophils: 46 %
Platelets: 366 10*3/uL (ref 150–379)
RBC: 4.45 x10E6/uL (ref 3.77–5.28)
RDW: 13.1 % (ref 12.3–15.4)
WBC: 5.7 10*3/uL (ref 3.4–10.8)

## 2017-02-16 LAB — BASIC METABOLIC PANEL
BUN/Creatinine Ratio: 11 (ref 9–23)
BUN: 8 mg/dL (ref 6–20)
CO2: 24 mmol/L (ref 20–29)
CREATININE: 0.73 mg/dL (ref 0.57–1.00)
Calcium: 9.5 mg/dL (ref 8.7–10.2)
Chloride: 98 mmol/L (ref 96–106)
GFR calc Af Amer: 125 mL/min/{1.73_m2} (ref >59)
GFR calc non Af Amer: 109 mL/min/{1.73_m2} (ref >59)
GLUCOSE: 86 mg/dL (ref 65–99)
Potassium: 4.6 mmol/L (ref 3.5–5.2)
Sodium: 138 mmol/L (ref 134–144)

## 2017-03-01 ENCOUNTER — Encounter: Payer: Self-pay | Admitting: Family Medicine

## 2017-03-03 ENCOUNTER — Encounter: Payer: Self-pay | Admitting: Family Medicine

## 2017-03-04 ENCOUNTER — Telehealth: Payer: Self-pay | Admitting: Family Medicine

## 2017-03-04 NOTE — Telephone Encounter (Signed)
DR SHAW PT CALLING TO LET YOU KNOW THAT THE ZITHROMAX THAT YOU GAVE HER DIDN'T DO ANYTHING FOR HER PLEASE RESPOND

## 2017-03-05 NOTE — Telephone Encounter (Signed)
Please advise. Does she need an OV ?

## 2017-03-10 NOTE — Telephone Encounter (Signed)
Then likely not bacterial. rec OV.

## 2017-03-11 NOTE — Telephone Encounter (Signed)
IC pt - advised to make appt.  Transferred to front desk for appt - pt wants next week

## 2017-03-15 ENCOUNTER — Ambulatory Visit: Payer: 59 | Admitting: Allergy and Immunology

## 2017-03-20 ENCOUNTER — Encounter: Payer: Self-pay | Admitting: Family Medicine

## 2017-04-07 ENCOUNTER — Telehealth: Payer: Self-pay | Admitting: Family Medicine

## 2017-04-07 NOTE — Telephone Encounter (Signed)
Pt is needing to talk with someone about changing the lisiopril she is developing the dry cough to where it is waking him up at night   Best number [phone]

## 2017-04-10 NOTE — Telephone Encounter (Signed)
Please advise 

## 2017-04-10 NOTE — Telephone Encounter (Signed)
Will change to [name]

## 2017-04-12 ENCOUNTER — Encounter: Payer: Self-pay | Admitting: Family Medicine

## 2017-04-12 MED ORDER — LOSARTAN POTASSIUM-HCTZ 100-25 MG PO TABS
1.0000 | ORAL_TABLET | Freq: Every day | ORAL | 0 refills | Status: DC
Start: 2017-04-12 — End: 2017-09-03

## 2017-04-12 NOTE — Telephone Encounter (Signed)
Sent mychart note about this

## 2017-07-03 ENCOUNTER — Other Ambulatory Visit: Payer: Self-pay | Admitting: Family Medicine

## 2017-08-02 ENCOUNTER — Ambulatory Visit: Payer: 59 | Admitting: Allergy and Immunology

## 2017-08-02 ENCOUNTER — Telehealth: Payer: Self-pay | Admitting: Family Medicine

## 2017-08-02 ENCOUNTER — Other Ambulatory Visit: Payer: Self-pay | Admitting: Family Medicine

## 2017-08-02 NOTE — Telephone Encounter (Signed)
Copied from CRM [phone]#[id number]. Topic: Quick Communication - See Telephone Encounter >> Aug 02, 2017 10:18 AM Rudi CocoLathan, Alessandria Henken M, NT wrote: CRM for notification. See Telephone encounter for:   08/02/17. Pt calling to see if she can get a 90 day rx. Instead of 30 day rx. On the med Coreg  [id number] [place],  - [address] [facility] [address]  [place] [id number] Phone: [phone] Fax: [phone] Open 24 hours

## 2017-08-04 NOTE — Telephone Encounter (Signed)
Patient states only 30 day supply was sent, need 90 day. Also requesting 90 day of lisinopril 20/25 mg

## 2017-08-05 NOTE — Telephone Encounter (Signed)
Pt needs office visit. Lisinopril not listed as one her medications.

## 2017-09-01 ENCOUNTER — Telehealth: Payer: Self-pay | Admitting: Family Medicine

## 2017-09-01 ENCOUNTER — Other Ambulatory Visit: Payer: Self-pay | Admitting: Family Medicine

## 2017-09-01 NOTE — Telephone Encounter (Signed)
Copied from CRM [id number]. Topic: Quick Communication - Rx Refill/Question >> Sep 01, 2017  6:27 PM [name], Julie Lucas: Medication: carvedilol (COREG) 12.5 MG tablet [[id number]][[id number]]  Has the patient contacted their pharmacy? yes  Pt states the Rx was denied contact pt to advise

## 2017-09-02 NOTE — Telephone Encounter (Unsigned)
Copied from CRM [id number]. Topic: Quick Communication - Rx Refill/Question >> Sep 01, 2017  6:27 PM [name], Harvey B wrote: Medication: carvedilol (COREG) 12.5 MG tablet [[id number]][[id number]]  Has the patient contacted their pharmacy? yes  Pt states the Rx was denied contact pt to advise >> Sep 02, 2017  5:44 PM Raquel SarnaHayes, Teresa G wrote: Pt says the refill of Carvedilol 12.5 was denied.  She wants to know why and what needs to be done to get her refills.  She will be out of this on Sat.   [facility] [address] [place] [phone]

## 2017-09-02 NOTE — Telephone Encounter (Signed)
Pt calling today to see why the pharmacy stated that she was denied for a refill of carvedilol (COREG)? Pt has only 1 day left of the rx. Please contact pt.

## 2017-09-03 ENCOUNTER — Other Ambulatory Visit: Payer: Self-pay

## 2017-09-03 ENCOUNTER — Encounter: Payer: Self-pay | Admitting: Family Medicine

## 2017-09-03 ENCOUNTER — Ambulatory Visit (INDEPENDENT_AMBULATORY_CARE_PROVIDER_SITE_OTHER): Payer: 59 | Admitting: Family Medicine

## 2017-09-03 VITALS — BP 132/94 | HR 95 | Temp 98.5°F | Ht 64.57 in | Wt 183.8 lb

## 2017-09-03 DIAGNOSIS — J019 Acute sinusitis, unspecified: Secondary | ICD-10-CM | POA: Diagnosis not present

## 2017-09-03 DIAGNOSIS — J3089 Other allergic rhinitis: Secondary | ICD-10-CM | POA: Diagnosis not present

## 2017-09-03 DIAGNOSIS — I10 Essential (primary) hypertension: Secondary | ICD-10-CM | POA: Diagnosis not present

## 2017-09-03 MED ORDER — LISINOPRIL 40 MG PO TABS: 40.0000 mg | ORAL_TABLET | Freq: Every day | ORAL | 1 refills | Status: DC

## 2017-09-03 MED ORDER — FLUTICASONE PROPIONATE 50 MCG/ACT NA SUSP: 1.0000 | Freq: Two times a day (BID) | NASAL | 5 refills | Status: DC

## 2017-09-03 MED ORDER — HYDROCHLOROTHIAZIDE 25 MG PO TABS: 25.0000 mg | ORAL_TABLET | Freq: Every day | ORAL | 1 refills | Status: DC

## 2017-09-03 MED ORDER — CARVEDILOL 12.5 MG PO TABS: 12.5000 mg | ORAL_TABLET | Freq: Two times a day (BID) | ORAL | 1 refills | Status: DC

## 2017-09-03 NOTE — Progress Notes (Signed)
1/11/201912:15 PM  Julie ChamberlainKimberlee D Shain 01/14/1981, 37 y.o. female [id number]  Chief Complaint  Patient presents with  . Sinus Problem  . Hypertension    hereditery hypertension, needs refill on meds, usually see's Clelia CroftShaw    HPI:   Patient is a 37 y.o. female with past medical history significant for HTN and seasonal allergies who presents today for about 5 days of sinus pressure and frontal headache, clear drainage, no fevers or chills. Takes zyretc and zantac daily. Does flonase maybe every other day or less. Has not done an oral decongestant as worried about her BP.  She is also requesting refill of BP meds, last took this morning. At first concerned lisinopril was causing a cough but then she realized that was related to her constant PND.   [facility] 09/03/2017 02/15/2017 11/12/2016  Decreased Interest 0 0 0  Down, Depressed, Hopeless 0 0 0  PHQ - 2 Score 0 0 0    Allergies  Allergen Reactions  . Augmentin [Amoxicillin-Pot Clavulanate] Nausea And Vomiting  . Sulfa Antibiotics Hives    Prior to Admission medications   Medication Sig Start Date End Date Taking? Authorizing Provider  carvedilol (COREG) 12.5 MG tablet Take 1 tablet (12.5 mg total) by mouth 2 (two) times daily with a meal. Office visit needed 08/02/17  Yes Sherren MochaShaw, Eva N, MD  levonorgestrel-ethinyl estradiol (SEASONALE,INTROVALE,JOLESSA) 0.15-0.03 MG tablet Take 1 tablet by mouth daily.   Yes [provider]  lisinopril-hydrochlorothiazide (PRINZIDE,ZESTORETIC) 20-25 MG tablet Take 1 tablet by mouth daily.   Yes [provider]  fluticasone (FLONASE) 50 MCG/ACT nasal spray Place 2 sprays into both nostrils daily as needed for allergies or rhinitis. Patient not taking: Reported on 09/03/2017 07/22/16   Bobbitt, Heywood Ilesalph Carter, MD  omeprazole (PRILOSEC) 20 MG capsule Take 20 mg by mouth daily.    [provider]    Past Medical History:  Diagnosis Date  . Allergic urticaria  03/05/2016  . Essential hypertension 11/19/2016  . Hypertension   . Inappropriate sinus tachycardia 11/19/2016    Past Surgical History:  Procedure Laterality Date  . thumb surgery      Social History   Tobacco Use  . Smoking status: Never Smoker  . Smokeless tobacco: Never Used  Substance Use Topics  . Alcohol use: Yes    Comment: occasionally    Family History  Problem Relation Age of Onset  . Allergic rhinitis Mother   . Hypertension Mother   . Allergic rhinitis Father   . Hypertension Father   . Hypertension Brother   . Diabetes Maternal Grandfather   . Diabetes Paternal Grandfather   . Angioedema Neg Hx   . Asthma Neg Hx   . Eczema Neg Hx   . Immunodeficiency Neg Hx   . Urticaria Neg Hx     ROS Per hpi  OBJECTIVE:  Blood pressure (!) 132/94, pulse 95, temperature 98.5 F (36.9 C), temperature source Oral, height 5' 4.57" (1.64 m), weight 183 lb 12.8 oz (83.4 kg), SpO2 98 %.  Physical Exam  Constitutional: She is oriented to person, place, and time and well-developed, well-nourished, and in no distress.  HENT:  Head: Normocephalic and atraumatic.  Right Ear: Hearing, tympanic membrane, external ear and ear canal normal.  Left Ear: Hearing, tympanic membrane, external ear and ear canal normal.  Nose: Mucosal edema and rhinorrhea (clear) present. Right sinus exhibits maxillary sinus tenderness and frontal sinus tenderness. Left sinus exhibits maxillary sinus tenderness and frontal sinus tenderness.  Mouth/Throat: Oropharynx is clear and moist.  OP cobblestoning noted  Eyes: EOM are normal. Pupils are equal, round, and reactive to light.  Neck: Neck supple.  Cardiovascular: Normal rate, regular rhythm and normal heart sounds. Exam reveals no gallop and no friction rub.  No murmur heard. Pulmonary/Chest: Effort normal and breath sounds normal. She has no wheezes. She has no rales.  Lymphadenopathy:    She has no cervical adenopathy.  Neurological: She is  alert and oriented to person, place, and time. Gait normal.  Skin: Skin is warm and dry.    ASSESSMENT and PLAN  1. Acute non-recurrent sinusitis, unspecified location Discussed with patient sinusitis 2/2 under treated allergies. No ssx of bacterial sinusitis present today. Discussed maximizing treatment by increasing flonase, adding nasal saline washes and oral decongestant. Consider adding azelastine, singulair. RTC precautions given.   2. Allergic rhinitis  3. Essential hypertension Above goal, increasing lisinopril. Discussed dash diet recommendations.  Other orders - fluticasone (FLONASE) 50 MCG/ACT nasal spray; Place 1 spray into both nostrils 2 (two) times daily. - carvedilol (COREG) 12.5 MG tablet; Take 1 tablet (12.5 mg total) by mouth 2 (two) times daily with a meal. - lisinopril (PRINIVIL,ZESTRIL) 40 MG tablet; Take 1 tablet (40 mg total) by mouth daily. - hydrochlorothiazide (HYDRODIURIL) 25 MG tablet; Take 1 tablet (25 mg total) by mouth daily.  Return in about 4 weeks (around 10/01/2017) for follow-up HTN.    Myles Lipps, MD Primary Care at [facility] [address] [place], [place] [address] Ph.  [phone] Fax [phone]

## 2017-09-03 NOTE — Patient Instructions (Signed)
     IF you received an x-ray today, you will receive an invoice from [facility]. Please contact [facility] at [phone] with questions or concerns regarding your invoice.   IF you received labwork today, you will receive an invoice from [facility]. Please contact [facility] at [phone] with questions or concerns regarding your invoice.   Our billing staff will not be able to assist you with questions regarding bills from these companies.  You will be contacted with the lab results as soon as they are available. The fastest way to get your results is to activate your My Chart account. Instructions are located on the last page of this paperwork. If you have not heard from us regarding the results in 2 weeks, please contact this office.     

## 2017-09-03 NOTE — Telephone Encounter (Signed)
Patient has appointment today with PCP- will discuss need for RF

## 2017-09-06 ENCOUNTER — Ambulatory Visit: Payer: 59 | Admitting: Family Medicine

## 2017-09-23 ENCOUNTER — Encounter: Payer: Self-pay | Admitting: Family Medicine

## 2017-09-25 MED ORDER — CARVEDILOL 12.5 MG PO TABS: 12.5000 mg | ORAL_TABLET | Freq: Two times a day (BID) | ORAL | 1 refills | Status: DC

## 2017-09-25 MED ORDER — LISINOPRIL-HYDROCHLOROTHIAZIDE 20-25 MG PO TABS: 1.0000 | ORAL_TABLET | Freq: Every day | ORAL | 1 refills | Status: DC

## 2017-10-08 ENCOUNTER — Ambulatory Visit: Payer: 59 | Admitting: Family Medicine

## 2018-03-08 ENCOUNTER — Other Ambulatory Visit: Payer: Self-pay | Admitting: Family Medicine

## 2018-03-09 NOTE — Telephone Encounter (Signed)
Prinzide 20-25 mg refill request  Had several No shows and cancellations.    Needs appt.  [facility] [address], [place]  Sees  Dr. Clelia CroftShaw and Dr. Leretha PolSantiago

## 2018-03-10 NOTE — Telephone Encounter (Signed)
Please either deny or accept the orders before sending to the scheduling pool we cannot address them until the orders are signed. Thank you

## 2018-03-10 NOTE — Telephone Encounter (Signed)
Per note, needs an appt for nx rf

## 2018-03-22 ENCOUNTER — Other Ambulatory Visit: Payer: Self-pay

## 2018-03-30 ENCOUNTER — Encounter: Payer: Self-pay | Admitting: Family Medicine

## 2018-03-30 ENCOUNTER — Ambulatory Visit (INDEPENDENT_AMBULATORY_CARE_PROVIDER_SITE_OTHER): Payer: 59 | Admitting: Family Medicine

## 2018-03-30 ENCOUNTER — Other Ambulatory Visit: Payer: Self-pay

## 2018-03-30 VITALS — BP 139/96 | HR 92 | Temp 98.6°F | Ht 64.75 in | Wt 190.8 lb

## 2018-03-30 DIAGNOSIS — I10 Essential (primary) hypertension: Secondary | ICD-10-CM

## 2018-03-30 DIAGNOSIS — R945 Abnormal results of liver function studies: Secondary | ICD-10-CM

## 2018-03-30 DIAGNOSIS — K219 Gastro-esophageal reflux disease without esophagitis: Secondary | ICD-10-CM

## 2018-03-30 DIAGNOSIS — Z6832 Body mass index (BMI) 32.0-32.9, adult: Secondary | ICD-10-CM | POA: Diagnosis not present

## 2018-03-30 DIAGNOSIS — R7989 Other specified abnormal findings of blood chemistry: Secondary | ICD-10-CM

## 2018-03-30 MED ORDER — OMEPRAZOLE 20 MG PO CPDR: 20.0000 mg | DELAYED_RELEASE_CAPSULE | Freq: Every day | ORAL | 2 refills | Status: DC

## 2018-03-30 MED ORDER — LISINOPRIL-HYDROCHLOROTHIAZIDE 20-25 MG PO TABS: 1.0000 | ORAL_TABLET | Freq: Every day | ORAL | 5 refills | Status: DC

## 2018-03-30 MED ORDER — CARVEDILOL 12.5 MG PO TABS: 12.5000 mg | ORAL_TABLET | Freq: Two times a day (BID) | ORAL | 5 refills | Status: DC

## 2018-03-30 NOTE — Progress Notes (Signed)
8/7/201911:56 AM  Julie Lucas 01/17/1981, 37 y.o. female [id number]  Chief Complaint  Patient presents with  . Hypertension    pt monitors bo at home, brought meter today. numbers look really good. BP is slightly due to "white coat syndrome" per pt    HPI:   Patient is a 37 y.o. 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 No No No No No     [facility] 03/30/2018 09/03/2017 02/15/2017  Decreased Interest 0 0 0  Down, Depressed, Hopeless 0 0 0  PHQ - 2 Score 0 0 0    Allergies  Allergen Reactions  . Augmentin [Amoxicillin-Pot Clavulanate] Nausea And Vomiting  . Sulfa Antibiotics Hives    Prior to Admission medications   Medication Sig Start Date End Date Taking? Authorizing Provider  carvedilol (COREG) 12.5 MG tablet Take 1 tablet (12.5 mg total) by mouth 2 (two) times daily with a meal. 09/25/17  Yes Myles LippsSantiago, Kayana Thoen M, MD  cetirizine (ZYRTEC) 10 MG tablet Take 10 mg by mouth daily.   Yes [provider]  fluticasone (FLONASE) 50 MCG/ACT nasal spray Place 1 spray into both nostrils 2 (two) times daily. 09/03/17  Yes Myles LippsSantiago, Ardice Boyan M, MD  levonorgestrel-ethinyl estradiol (SEASONALE,INTROVALE,JOLESSA) 0.15-0.03 MG tablet Take 1 tablet by mouth daily.   Yes [provider]  lisinopril-hydrochlorothiazide (PRINZIDE,ZESTORETIC) 20-25 MG tablet Take 1 tablet by mouth daily. 09/25/17  Yes Myles LippsSantiago, Cinderella Christoffersen M, MD  omeprazole (PRILOSEC) 20 MG capsule Take 20 mg by  mouth daily.   Yes [provider]  ranitidine (ZANTAC) 15 MG/ML syrup Take by mouth 2 (two) times daily.   Yes [provider]    Past Medical History:  Diagnosis Date  . Allergic urticaria 03/05/2016  . Essential hypertension 11/19/2016  . Hypertension   . Inappropriate sinus tachycardia 11/19/2016    Past Surgical History:  Procedure Laterality Date  . thumb surgery      Social History   Tobacco Use  . Smoking status: Never Smoker  . Smokeless tobacco: Never Used  Substance Use Topics  . Alcohol use: Yes    Comment: occasionally    Family History  Problem Relation Age of Onset  . Allergic rhinitis Mother   . Hypertension Mother   . Allergic rhinitis Father   . Hypertension Father   . Hypertension Brother   . Diabetes Maternal Grandfather   . Diabetes Paternal Grandfather   . Angioedema Neg Hx   . Asthma Neg Hx   . Eczema Neg Hx   . Immunodeficiency Neg Hx   . Urticaria Neg Hx     Review of Systems  Constitutional: Negative for chills and fever.  Respiratory: Negative for cough and shortness of breath.   Cardiovascular: Negative for chest pain, palpitations and leg swelling.  Gastrointestinal: Negative for abdominal pain, nausea and vomiting.     OBJECTIVE:  Blood pressure (!) 139/96, pulse 92, temperature 98.6 F (37 C), temperature source Oral, height 5' 4.75" (1.645 m),  weight 190 lb 12.8 oz (86.5 kg), last menstrual period 02/21/2018, SpO2 98 %. Body mass index is 32 kg/m.   Wt Readings from Last 3 Encounters:  03/30/18 190 lb 12.8 oz (86.5 kg)  09/03/17 183 lb 12.8 oz (83.4 kg)  02/15/17 177 lb (80.3 kg)    Physical Exam  Constitutional: She is oriented to person, place, and time.  HENT:  Head: Normocephalic and atraumatic.  Mouth/Throat: Oropharynx is clear and moist. No oropharyngeal exudate.  Eyes: Pupils are equal, round, and reactive to light. EOM are normal. No scleral icterus.  Neck: Neck supple.  Cardiovascular:  Normal rate, regular rhythm and normal heart sounds. Exam reveals no gallop and no friction rub.  No murmur heard. Pulmonary/Chest: Effort normal and breath sounds normal. She has no wheezes. She has no rales.  Musculoskeletal: She exhibits no edema.  Neurological: She is alert and oriented to person, place, and time.  Skin: Skin is warm and dry.    ASSESSMENT and PLAN  1. Essential hypertension Controlled per home readings. Complicated by white coat syndrome. Continue current regime. - Comprehensive metabolic panel - TSH - Lipid panel  2. BMI 32.0-32.9,adult Provided counseling regarding diet and exercise.  - TSH - Lipid panel  3. GERD without esophagitis Controlled. Continue current regime.   Other orders - ranitidine (ZANTAC) 15 MG/ML syrup; Take by mouth 2 (two) times daily. - cetirizine (ZYRTEC) 10 MG tablet; Take 10 mg by mouth daily. - carvedilol (COREG) 12.5 MG tablet; Take 1 tablet (12.5 mg total) by mouth 2 (two) times daily with a meal. - lisinopril-hydrochlorothiazide (PRINZIDE,ZESTORETIC) 20-25 MG tablet; Take 1 tablet by mouth daily. - omeprazole (PRILOSEC) 20 MG capsule; Take 1 capsule (20 mg total) by mouth daily.   Return in about 6 months (around 09/30/2018).    Myles Lipps, MD Primary Care at [facility] [address] [place], [place] [address] Ph.  [phone] Fax [phone]

## 2018-03-30 NOTE — Patient Instructions (Addendum)
   IF you received an x-ray today, you will receive an invoice from [facility]. Please contact [facility] at [phone] with questions or concerns regarding your invoice.   IF you received labwork today, you will receive an invoice from [facility]. Please contact [facility] at [phone] with questions or concerns regarding your invoice.   Our billing staff will not be able to assist you with questions regarding bills from these companies.  You will be contacted with the lab results as soon as they are available. The fastest way to get your results is to activate your My Chart account. Instructions are located on the last page of this paperwork. If you have not heard from us regarding the results in 2 weeks, please contact this office.     Exercising to Lose Weight Exercising can help you to lose weight. In order to lose weight through exercise, you need to do vigorous-intensity exercise. You can tell that you are exercising with vigorous intensity if you are breathing very hard and fast and cannot hold a conversation while exercising. Moderate-intensity exercise helps to maintain your current weight. You can tell that you are exercising at a moderate level if you have a higher heart rate and faster breathing, but you are still able to hold a conversation. How often should I exercise? Choose an activity that you enjoy and set realistic goals. Your health care provider can help you to make an activity plan that works for you. Exercise regularly as directed by your health care provider. This may include:  Doing resistance training twice each week, such as: ? Push-ups. ? Sit-ups. ? Lifting weights. ? Using resistance bands.  Doing a given intensity of exercise for a given amount of time. Choose from these options: ? 150 minutes of moderate-intensity exercise every week. ? 75 minutes of vigorous-intensity exercise every week. ? A mix of moderate-intensity and  vigorous-intensity exercise every week.  Children, pregnant women, people who are out of shape, people who are overweight, and older adults may need to consult a health care provider for individual recommendations. If you have any sort of medical condition, be sure to consult your health care provider before starting a new exercise program. What are some activities that can help me to lose weight?  Walking at a rate of at least 4.5 miles an hour.  Jogging or running at a rate of 5 miles per hour.  Biking at a rate of at least 10 miles per hour.  Lap swimming.  Roller-skating or in-line skating.  Cross-country skiing.  Vigorous competitive sports, such as football, basketball, and soccer.  Jumping rope.  Aerobic dancing. How can I be more active in my day-to-day activities?  Use the stairs instead of the elevator.  Take a walk during your lunch break.  If you drive, park your car farther away from work or school.  If you take public transportation, get off one stop early and walk the rest of the way.  Make all of your phone calls while standing up and walking around.  Get up, stretch, and walk around every 30 minutes throughout the day. What guidelines should I follow while exercising?  Do not exercise so much that you hurt yourself, feel dizzy, or get very short of breath.  Consult your health care provider prior to starting a new exercise program.  Wear comfortable clothes and shoes with good support.  Drink plenty of water while you exercise to prevent dehydration or heat stroke. Body water is   lost during exercise and must be replaced.  Work out until you breathe faster and your heart beats faster. This information is not intended to replace advice given to you by your health care provider. Make sure you discuss any questions you have with your health care provider. Document Released: 09/12/2010 Document Revised: 01/16/2016 Document Reviewed: 01/11/2014 Elsevier  Interactive Patient Education  2018 Elsevier Inc.  

## 2018-03-31 LAB — COMPREHENSIVE METABOLIC PANEL
ALT: 244 IU/L — ABNORMAL HIGH (ref 0–32)
AST: 132 IU/L — ABNORMAL HIGH (ref 0–40)
Albumin/Globulin Ratio: 1.5 (ref 1.2–2.2)
Albumin: 4.3 g/dL (ref 3.5–5.5)
Alkaline Phosphatase: 62 IU/L (ref 39–117)
BUN/Creatinine Ratio: 8 — ABNORMAL LOW (ref 9–23)
BUN: 6 mg/dL (ref 6–20)
Bilirubin Total: 0.6 mg/dL (ref 0.0–1.2)
CO2: 22 mmol/L (ref 20–29)
Calcium: 9.6 mg/dL (ref 8.7–10.2)
Chloride: 101 mmol/L (ref 96–106)
Creatinine, Ser: 0.76 mg/dL (ref 0.57–1.00)
GFR calc Af Amer: 118 mL/min/{1.73_m2} (ref >59)
GFR calc non Af Amer: 102 mL/min/{1.73_m2} (ref >59)
Globulin, Total: 2.9 g/dL (ref 1.5–4.5)
Glucose: 88 mg/dL (ref 65–99)
Potassium: 4.2 mmol/L (ref 3.5–5.2)
Sodium: 139 mmol/L (ref 134–144)
Total Protein: 7.2 g/dL (ref 6.0–8.5)

## 2018-03-31 LAB — LIPID PANEL
Chol/HDL Ratio: 3.1 ratio (ref 0.0–4.4)
Cholesterol, Total: 178 mg/dL (ref 100–199)
HDL: 58 mg/dL (ref >39)
LDL Calculated: 103 mg/dL — ABNORMAL HIGH (ref 0–99)
Triglycerides: 84 mg/dL (ref 0–149)
VLDL Cholesterol Cal: 17 mg/dL (ref 5–40)

## 2018-03-31 LAB — TSH: TSH: 1.36 u[IU]/mL (ref 0.450–4.500)

## 2018-03-31 NOTE — Telephone Encounter (Signed)
Send message to Dr. Leretha PolSantiago please.

## 2018-04-04 ENCOUNTER — Telehealth: Payer: Self-pay | Admitting: Family Medicine

## 2018-04-04 NOTE — Telephone Encounter (Signed)
Copied from CRM [phone]. Topic: General - Other >> Apr 04, 2018  7:52 AM Gean BirchwoodWilliams-Neal, Sade R wrote: Pt is calling in due to some questions about her lab results that were given to her.  Cb# [id number]

## 2018-04-06 NOTE — Telephone Encounter (Signed)
Patient called and is wondering if Dr. Clelia CroftShaw can call her back about her lab results. She has some questions . ., Patient is aware that Dr. Leretha PolSantiago is not in the office this week. She is concern about some of the numbers. CB# [phone]

## 2018-04-06 NOTE — Telephone Encounter (Signed)
Patient is calling to follow up on this. I informed the patient that she was out of the office the rest of this week. Patient is wanting a follow up message or call.

## 2018-04-07 NOTE — Telephone Encounter (Signed)
I messaged patient directly

## 2018-04-07 NOTE — Telephone Encounter (Signed)
Pt calling to follow up. Please advise.

## 2018-04-07 NOTE — Addendum Note (Signed)
Addended by: Myles LippsSANTIAGO, Maley Venezia M on: 04/07/2018 10:58 PM   Modules accepted: Orders

## 2018-04-12 NOTE — Telephone Encounter (Addendum)
Pt would like to know if she should repeat the labs you ordered now, or when? Pt states ok to send a mychart message to advise on when to do. Thank you.

## 2018-04-15 ENCOUNTER — Ambulatory Visit (INDEPENDENT_AMBULATORY_CARE_PROVIDER_SITE_OTHER): Payer: 59 | Admitting: Family Medicine

## 2018-04-15 DIAGNOSIS — R945 Abnormal results of liver function studies: Secondary | ICD-10-CM

## 2018-04-15 DIAGNOSIS — R7989 Other specified abnormal findings of blood chemistry: Secondary | ICD-10-CM

## 2018-04-15 NOTE — Progress Notes (Signed)
Lab visit

## 2018-04-16 LAB — COMPREHENSIVE METABOLIC PANEL
ALT: 270 IU/L — ABNORMAL HIGH (ref 0–32)
AST: 137 IU/L — ABNORMAL HIGH (ref 0–40)
Albumin/Globulin Ratio: 1.4 (ref 1.2–2.2)
Albumin: 4.1 g/dL (ref 3.5–5.5)
Alkaline Phosphatase: 69 IU/L (ref 39–117)
BUN/Creatinine Ratio: 7 — ABNORMAL LOW (ref 9–23)
BUN: 6 mg/dL (ref 6–20)
Bilirubin Total: 0.5 mg/dL (ref 0.0–1.2)
CO2: 22 mmol/L (ref 20–29)
Calcium: 9.3 mg/dL (ref 8.7–10.2)
Chloride: 99 mmol/L (ref 96–106)
Creatinine, Ser: 0.83 mg/dL (ref 0.57–1.00)
GFR calc Af Amer: 106 mL/min/{1.73_m2} (ref >59)
GFR calc non Af Amer: 92 mL/min/{1.73_m2} (ref >59)
Globulin, Total: 2.9 g/dL (ref 1.5–4.5)
Glucose: 117 mg/dL — ABNORMAL HIGH (ref 65–99)
Potassium: 4.2 mmol/L (ref 3.5–5.2)
Sodium: 138 mmol/L (ref 134–144)
Total Protein: 7 g/dL (ref 6.0–8.5)

## 2018-04-20 ENCOUNTER — Telehealth: Payer: Self-pay | Admitting: Family Medicine

## 2018-04-20 ENCOUNTER — Encounter: Payer: Self-pay | Admitting: Family Medicine

## 2018-04-20 NOTE — Addendum Note (Signed)
Addended by: Myles LippsSANTIAGO, Marisela Line M on: 04/20/2018 12:01 AM   Modules accepted: Orders

## 2018-04-20 NOTE — Telephone Encounter (Signed)
Message was routed to doctor today

## 2018-04-20 NOTE — Telephone Encounter (Unsigned)
Copied from CRM #[id number]. [phone]: Quick Communication - See Telephone Encounter >> Apr 20, 2018 12:40 PM Raquel SarnaHayes, Teresa G wrote: CRM for notification. See Telephone encounter for: 04/20/18.

## 2018-04-22 ENCOUNTER — Ambulatory Visit: Payer: 59

## 2018-04-22 DIAGNOSIS — R7989 Other specified abnormal findings of blood chemistry: Secondary | ICD-10-CM

## 2018-04-22 DIAGNOSIS — R945 Abnormal results of liver function studies: Secondary | ICD-10-CM

## 2018-04-23 LAB — ACUTE HEP PANEL AND HEP B SURFACE AB
Hep A IgM: NEGATIVE
Hep B C IgM: NEGATIVE
Hep C Virus Ab: <0.1 s/co ratio (ref 0.0–0.9)
Hepatitis B Surf Ab Quant: <3.1 m[IU]/mL — ABNORMAL LOW (ref >9.9)
Hepatitis B Surface Ag: NEGATIVE

## 2018-04-23 LAB — ANA W/RFX TO ALL IF POSITIVE: Anti Nuclear Antibody(ANA): NEGATIVE

## 2018-04-23 LAB — FERRITIN: Ferritin: 45 ng/mL (ref 15–150)

## 2018-04-26 ENCOUNTER — Telehealth: Payer: Self-pay

## 2018-04-26 ENCOUNTER — Ambulatory Visit
Admission: RE | Admit: 2018-04-26 | Discharge: 2018-04-26 | Disposition: A | Discharge status: Home / Self Care | Payer: 59 | Source: Ambulatory Visit | Attending: Family Medicine | Admitting: Family Medicine

## 2018-04-26 DIAGNOSIS — R7989 Other specified abnormal findings of blood chemistry: Secondary | ICD-10-CM

## 2018-04-26 DIAGNOSIS — R945 Abnormal results of liver function studies: Secondary | ICD-10-CM

## 2018-04-26 NOTE — Telephone Encounter (Signed)
Spoke to pt regarding why is she is to go to [place], pt expresses understanding. She ill f/u after the Gastro appt. 9/9 appt will be cancelled

## 2018-04-27 ENCOUNTER — Encounter: Payer: Self-pay | Admitting: Gastroenterology

## 2018-05-02 ENCOUNTER — Ambulatory Visit: Payer: 59 | Admitting: Family Medicine

## 2018-06-08 ENCOUNTER — Ambulatory Visit (INDEPENDENT_AMBULATORY_CARE_PROVIDER_SITE_OTHER): Payer: 59 | Admitting: Gastroenterology

## 2018-06-08 ENCOUNTER — Encounter: Payer: Self-pay | Admitting: Gastroenterology

## 2018-06-08 ENCOUNTER — Other Ambulatory Visit (INDEPENDENT_AMBULATORY_CARE_PROVIDER_SITE_OTHER): Payer: 59

## 2018-06-08 VITALS — BP 110/86 | HR 76 | Ht 64.75 in | Wt 194.0 lb

## 2018-06-08 DIAGNOSIS — R945 Abnormal results of liver function studies: Secondary | ICD-10-CM

## 2018-06-08 DIAGNOSIS — R7989 Other specified abnormal findings of blood chemistry: Secondary | ICD-10-CM

## 2018-06-08 LAB — COMPREHENSIVE METABOLIC PANEL
ALK PHOS: 57 U/L (ref 39–117)
ALT: 23 U/L (ref 0–35)
AST: 15 U/L (ref 0–37)
Albumin: 3.9 g/dL (ref 3.5–5.2)
BILIRUBIN TOTAL: 0.6 mg/dL (ref 0.2–1.2)
BUN: 7 mg/dL (ref 6–23)
CALCIUM: 9.1 mg/dL (ref 8.4–10.5)
CO2: 29 meq/L (ref 19–32)
CREATININE: 0.75 mg/dL (ref 0.40–1.20)
Chloride: 101 mEq/L (ref 96–112)
GFR: 112.96 mL/min (ref >60.00)
GLUCOSE: 117 mg/dL — AB (ref 70–99)
Potassium: 3.5 mEq/L (ref 3.5–5.1)
Sodium: 136 mEq/L (ref 135–145)
Total Protein: 7.5 g/dL (ref 6.0–8.3)

## 2018-06-08 LAB — IGA: IGA: 213 mg/dL (ref 68–378)

## 2018-06-08 LAB — CBC WITH DIFFERENTIAL/PLATELET
BASOS ABS: 0.1 10*3/uL (ref 0.0–0.1)
Basophils Relative: 1.4 % (ref 0.0–3.0)
EOS ABS: 0.1 10*3/uL (ref 0.0–0.7)
Eosinophils Relative: 2.3 % (ref 0.0–5.0)
HCT: 38.9 % (ref 36.0–46.0)
Hemoglobin: 12.9 g/dL (ref 12.0–15.0)
LYMPHS ABS: 2.1 10*3/uL (ref 0.7–4.0)
Lymphocytes Relative: 37.7 % (ref 12.0–46.0)
MCHC: 33.1 g/dL (ref 30.0–36.0)
MCV: 87.6 fl (ref 78.0–100.0)
Monocytes Absolute: 0.5 10*3/uL (ref 0.1–1.0)
Monocytes Relative: 9.8 % (ref 3.0–12.0)
NEUTROS ABS: 2.7 10*3/uL (ref 1.4–7.7)
NEUTROS PCT: 48.8 % (ref 43.0–77.0)
PLATELETS: 351 10*3/uL (ref 150.0–400.0)
RBC: 4.44 Mil/uL (ref 3.87–5.11)
RDW: 13.7 % (ref 11.5–15.5)
WBC: 5.6 10*3/uL (ref 4.0–10.5)

## 2018-06-08 LAB — PROTIME-INR
INR: 1 ratio (ref 0.8–1.0)
PROTHROMBIN TIME: 12 s (ref 9.6–13.1)

## 2018-06-08 NOTE — Progress Notes (Signed)
HPI: This is a very pleasant 37 year old woman who was referred to me by Rutherford Guys, MD  to evaluate elevated liver tests.    Chief complaint is elevated liver tests  Last January 2018 she started antiHTN meds; carvedilol and lisinopril.  Her liver tests prior to starting the medicines were normal.  During routine blood testing during an history and physical August 2019 her transaminases were both found to be significantly elevated in the 1-200 range.  See those results summarized below.  She had follow-up further blood testing as well as an ultrasound.  Those results are all summarized below as well.  She has no history of hepatitis, no history of jaundice, no illicit drug use, she does not drink much alcohol, she does not take Tylenol very often.  Lives her disease does not run in her family.  She has no significant abdominal pains.  She does not recall any symptoms of a viral type illness back in August when her liver numbers were seen to be quite elevated.  One occasion of GERD  She's gained 10 pounds in past year.     Old Data Reviewed:  Blood work August 2019:  AST ranged from 132-137, ALT ranged 244-270, Total bili alk phos both normal, ANA negative,, Ferritin normal Hepatitis A IgM negative, hepatitis B surface antigen negative, hepatitis B surface antibody negative, hepatitis C antibody negative  [place] 04/2018: normal     Review of systems: Pertinent positive and negative review of systems were noted in the above HPI section. All other review negative.   Past Medical History:  Diagnosis Date  . Allergic urticaria 03/05/2016  . Essential hypertension 11/19/2016  . Hypertension   . Inappropriate sinus tachycardia 11/19/2016    Past Surgical History:  Procedure Laterality Date  . thumb surgery      Current Outpatient Medications  Medication Sig Dispense Refill  . carvedilol (COREG) 12.5 MG tablet Take 1 tablet (12.5 mg total) by mouth 2 (two) times daily with a  meal. 60 tablet 5  . cetirizine (ZYRTEC) 10 MG tablet Take 10 mg by mouth daily.    . fluticasone (FLONASE) 50 MCG/ACT nasal spray Place 1 spray into both nostrils 2 (two) times daily. 16 g 5  . levonorgestrel-ethinyl estradiol (SEASONALE,INTROVALE,JOLESSA) 0.15-0.03 MG tablet Take 1 tablet by mouth daily.    Marland Kitchen lisinopril-hydrochlorothiazide (PRINZIDE,ZESTORETIC) 20-25 MG tablet Take 1 tablet by mouth daily. 30 tablet 5  . omeprazole (PRILOSEC) 20 MG capsule Take 1 capsule (20 mg total) by mouth daily. 30 capsule 2  . ranitidine (ZANTAC) 15 MG/ML syrup Take by mouth 2 (two) times daily.     No current facility-administered medications for this visit.     Allergies as of 06/08/2018 - Review Complete 06/08/2018  Allergen Reaction Noted  . Augmentin [amoxicillin-pot clavulanate] Nausea And Vomiting 01/26/2012  . Sulfa antibiotics Hives 01/26/2012    Family History  Problem Relation Age of Onset  . Allergic rhinitis Mother   . Hypertension Mother   . Allergic rhinitis Father   . Hypertension Father   . Hypertension Brother   . Diabetes Maternal Grandfather   . Diabetes Paternal Grandfather   . Angioedema Neg Hx   . Asthma Neg Hx   . Eczema Neg Hx   . Immunodeficiency Neg Hx   . Urticaria Neg Hx     Social History   Socioeconomic History  . Marital status: Single    Spouse name: Not on file  . Number of children: Not  on file  . Years of education: Not on file  . Highest education level: Not on file  Occupational History  . Not on file  Social Needs  . Financial resource strain: Not on file  . Food insecurity:    Worry: Not on file    Inability: Not on file  . Transportation needs:    Medical: Not on file    Non-medical: Not on file  Tobacco Use  . Smoking status: Never Smoker  . Smokeless tobacco: Never Used  Substance and Sexual Activity  . Alcohol use: Yes    Comment: occasionally  . Drug use: No  . Sexual activity: Not on file  Lifestyle  . Physical activity:     Days per week: Not on file    Minutes per session: Not on file  . Stress: Not on file  Relationships  . Social connections:    Talks on phone: Not on file    Gets together: Not on file    Attends religious service: Not on file    Active member of club or organization: Not on file    Attends meetings of clubs or organizations: Not on file    Relationship status: Not on file  . Intimate partner violence:    Fear of current or ex partner: Not on file    Emotionally abused: Not on file    Physically abused: Not on file    Forced sexual activity: Not on file  Other Topics Concern  . Not on file  Social History Narrative  . Not on file     Physical Exam: BP 110/86   Pulse 76   Ht 5' 4.75" (1.645 m)   Wt 194 lb (88 kg)   LMP 05/26/2018 (Exact Date)   BMI 32.53 kg/m  Constitutional: generally well-appearing Psychiatric: alert and oriented x3 Eyes: extraocular movements intact Mouth: oral pharynx moist, no lesions Neck: supple no lymphadenopathy Cardiovascular: heart regular rate and rhythm Lungs: clear to auscultation bilaterally Abdomen: soft, nontender, nondistended, no obvious ascites, no peritoneal signs, normal bowel sounds Extremities: no lower extremity edema bilaterally Skin: no lesions on visible extremities   Assessment and plan: 37 y.o. female with elevated liver tests  Hepatitis A, B, C have all been excluded.  Her ferritin was normal and her ANA was negative.  I explained that there were other potential causes of elevated liver tests and I recommended further blood testing.  See specific tests in patient instructions.  I told her that I felt that there was a good chance that she has fatty liver disease given her obesity and the fact that she has been continuing to gain weight over the past year.  Her transaminase elevation is admittedly higher than usual for fatty liver disease.  Lisinopril does have hepatotoxicity listed as a potential side effect however quite  down the list of common side effects.  It may be worth holding the lisinopril depending on her blood test results.  She also understands that biopsy of the liver may be necessary depending on her clinical course and the results of her lab testing.    Please see the "Patient Instructions" section for addition details about the plan.   Owens Loffler, MD [facility] 06/08/2018, 2:58 PM  Cc: Rutherford Guys, MD

## 2018-06-08 NOTE — Patient Instructions (Addendum)
You will get labs drawn today:  AMA,  anti smooth muscle antibody, alpha 1 antitrypsin, cerulloplasm, TTG, total IgA level, CBC, CMET, INR.  You may need further testing pending the results.  You may need to stop your lisinopril depending on the above results.  Thank you for entrusting me with your care and choosing [facility].  Dr Christella Hartigan

## 2018-06-12 LAB — TISSUE TRANSGLUTAMINASE, IGA: (tTG) Ab, IgA: 1 U/mL

## 2018-06-12 LAB — MITOCHONDRIAL ANTIBODIES: Mitochondrial M2 Ab, IgG: <=20 U

## 2018-06-12 LAB — ALPHA-1-ANTITRYPSIN: A1 ANTITRYPSIN SER: 205 mg/dL — AB (ref 83–199)

## 2018-06-12 LAB — ANTI-SMOOTH MUSCLE ANTIBODY, IGG: Actin (Smooth Muscle) Antibody (IGG): 22 U — ABNORMAL HIGH (ref <20)

## 2018-06-12 LAB — CERULOPLASMIN: CERULOPLASMIN: 51 mg/dL (ref 18–53)

## 2018-09-16 ENCOUNTER — Other Ambulatory Visit: Payer: Self-pay | Admitting: Family Medicine

## 2018-09-16 NOTE — Telephone Encounter (Signed)
Pt due for an appointment in 1 month- Requested Prescriptions  Pending Prescriptions Disp Refills  . lisinopril-hydrochlorothiazide (PRINZIDE,ZESTORETIC) 20-25 MG tablet [Pharmacy Med Name: LISINOPRIL-HCTZ 20-25 MG TAB] 30 tablet 0    Sig: TAKE 1 TABLET BY MOUTH EVERY DAY     Cardiovascular:  ACEI + Diuretic Combos Passed - 09/16/2018 11:08 AM      Passed - Na in normal range and within 180 days    Sodium  Date Value Ref Range Status  06/08/2018 136 135 - 145 mEq/L Final  04/15/2018 138 134 - 144 mmol/L Final         Passed - K in normal range and within 180 days    Potassium  Date Value Ref Range Status  06/08/2018 3.5 3.5 - 5.1 mEq/L Final         Passed - Cr in normal range and within 180 days    Creatinine, Ser  Date Value Ref Range Status  06/08/2018 0.75 0.40 - 1.20 mg/dL Final         Passed - Ca in normal range and within 180 days    Calcium  Date Value Ref Range Status  06/08/2018 9.1 8.4 - 10.5 mg/dL Final         Passed - Patient is not pregnant      Passed - Last BP in normal range    BP Readings from Last 1 Encounters:  06/08/18 110/86         Passed - Valid encounter within last 6 months    Recent Outpatient Visits          5 months ago Abnormal liver function tests   [facility], Meda Coffee, MD   5 months ago Essential hypertension   [facility], Meda Coffee, MD   1 year ago Acute non-recurrent sinusitis, unspecified location   [facility], Meda Coffee, MD   1 year ago Acute non-recurrent maxillary sinusitis   [facility], Levell July, MD   1 year ago Chronic tachycardia   [facility], Levell July, MD

## 2018-10-11 ENCOUNTER — Ambulatory Visit: Payer: Self-pay

## 2018-10-11 NOTE — Telephone Encounter (Signed)
Pt c/o 2 weeks of severe upper teeth pain. Pt stated she went to the dentist to get her teeth examined. Pt stated that the dentist did not find anything wrong with her teeth, but on the xray the dentist told her she has sinus inflammation.  Pt also c/o mild cheek pain. Pt stated that she has frequent sinus infections. Pt stated her nose is blocked. Pt is afebrile. Pt having small amount of clear nasal drainage. Pt has a dry cough. Care advice given and pt verbalized understanding. No appts available at [facility]. Pt advised to go to [facility].   Reason for Disposition . [1] Sinus congestion (pressure, fullness) AND [2] present > 10 days  Answer Assessment - Initial Assessment Questions 1. LOCATION: "Where does it hurt?"      Upper teeth, cheek pain 2. ONSET: "When did the sinus pain start?"  (e.g., hours, days)      2 weeks ago 3. SEVERITY: "How bad is the pain?"   (Scale 1-10; mild, moderate or severe)   - MILD (1-3): doesn't interfere with normal activities    - MODERATE (4-7): interferes with normal activities (e.g., work or school) or awakens from sleep   - SEVERE (8-10): excruciating pain and patient unable to do any normal activities        Teeth pain: severe comes and goes- cheek pain: 3-4 4. RECURRENT SYMPTOM: "Have you ever had sinus problems before?" If so, ask: "When was the last time?" and "What happened that time?"      Yes- last year got antibiotics 5. NASAL CONGESTION: "Is the nose blocked?" If so, ask, "Can you open it or must you breathe through the mouth?"    Yes- has to breathe through mouth 6. NASAL DISCHARGE: "Do you have discharge from your nose?" If so ask, "What color?"     Yes- small amount clear 7. FEVER: "Do you have a fever?" If so, ask: "What is it, how was it measured, and when did it start?"      no 8. OTHER SYMPTOMS: "Do you have any other symptoms?" (e.g., sore throat, cough, earache, difficulty breathing)     Cough (dry) 9. PREGNANCY: "Is there any chance you are  pregnant?" "When was your last menstrual period?"     No- LMP: beginning of January on 3 month BCP  Protocols used: SINUS PAIN OR CONGESTION-A-AH

## 2018-10-27 ENCOUNTER — Other Ambulatory Visit: Payer: Self-pay | Admitting: Family Medicine

## 2018-10-27 NOTE — Telephone Encounter (Signed)
Requested medication (s) are due for refill today -yes  Requested medication (s) are on the active medication list -yes  Future visit scheduled -no  Last refill: 09/16/18  Notes to clinic: Patient has had a 30 day courtesy refill. Sent to office for review of request.  Requested Prescriptions  Pending Prescriptions Disp Refills   lisinopril-hydrochlorothiazide (PRINZIDE,ZESTORETIC) 20-25 MG tablet [Pharmacy Med Name: LISINOPRIL-HCTZ 20-25 MG TAB] 30 tablet 0    Sig: TAKE 1 TABLET BY MOUTH EVERY DAY     Cardiovascular:  ACEI + Diuretic Combos Failed - 10/27/2018  2:36 PM      Failed - Valid encounter within last 6 months    Recent Outpatient Visits          6 months ago Abnormal liver function tests   [facility], Meda Coffee, MD   7 months ago Essential hypertension   [facility], Meda Coffee, MD   1 year ago Acute non-recurrent sinusitis, unspecified location   [facility], Meda Coffee, MD   1 year ago Acute non-recurrent maxillary sinusitis   [facility], Levell July, MD   1 year ago Chronic tachycardia   [facility], Levell July, MD             Passed - Na in normal range and within 180 days    Sodium  Date Value Ref Range Status  06/08/2018 136 135 - 145 mEq/L Final  04/15/2018 138 134 - 144 mmol/L Final         Passed - K in normal range and within 180 days    Potassium  Date Value Ref Range Status  06/08/2018 3.5 3.5 - 5.1 mEq/L Final         Passed - Cr in normal range and within 180 days    Creatinine, Ser  Date Value Ref Range Status  06/08/2018 0.75 0.40 - 1.20 mg/dL Final         Passed - Ca in normal range and within 180 days    Calcium  Date Value Ref Range Status  06/08/2018 9.1 8.4 - 10.5 mg/dL Final         Passed - Patient is not pregnant      Passed - Last BP in normal range    BP Readings from Last 1 Encounters:  06/08/18 110/86          Requested Prescriptions   Pending Prescriptions Disp Refills   lisinopril-hydrochlorothiazide (PRINZIDE,ZESTORETIC) 20-25 MG tablet [Pharmacy Med Name: LISINOPRIL-HCTZ 20-25 MG TAB] 30 tablet 0    Sig: TAKE 1 TABLET BY MOUTH EVERY DAY     Cardiovascular:  ACEI + Diuretic Combos Failed - 10/27/2018  2:36 PM      Failed - Valid encounter within last 6 months    Recent Outpatient Visits          6 months ago Abnormal liver function tests   [facility], Meda Coffee, MD   7 months ago Essential hypertension   [facility], Meda Coffee, MD   1 year ago Acute non-recurrent sinusitis, unspecified location   [facility], Meda Coffee, MD   1 year ago Acute non-recurrent maxillary sinusitis   [facility], Levell July, MD   1 year ago Chronic tachycardia   [facility], Levell July, MD  Passed - Na in normal range and within 180 days    Sodium  Date Value Ref Range Status  06/08/2018 136 135 - 145 mEq/L Final  04/15/2018 138 134 - 144 mmol/L Final         Passed - K in normal range and within 180 days    Potassium  Date Value Ref Range Status  06/08/2018 3.5 3.5 - 5.1 mEq/L Final         Passed - Cr in normal range and within 180 days    Creatinine, Ser  Date Value Ref Range Status  06/08/2018 0.75 0.40 - 1.20 mg/dL Final         Passed - Ca in normal range and within 180 days    Calcium  Date Value Ref Range Status  06/08/2018 9.1 8.4 - 10.5 mg/dL Final         Passed - Patient is not pregnant      Passed - Last BP in normal range    BP Readings from Last 1 Encounters:  06/08/18 110/86

## 2018-11-09 ENCOUNTER — Other Ambulatory Visit: Payer: Self-pay | Admitting: Family Medicine

## 2018-11-09 NOTE — Telephone Encounter (Signed)
Requested medication (s) are due for refill today -yes  Requested medication (s) are on the active medication list -yes  Future visit scheduled -no  Last refill: carvedilol- 03/30/18                  Prinzide- 10/28/18  Notes to clinic: Patient is requesting refills on medications that require follow up- patient has had courtesy refills already. Sent for PCP review.  Requested Prescriptions  Pending Prescriptions Disp Refills   carvedilol (COREG) 12.5 MG tablet [Pharmacy Med Name: CARVEDILOL 12.5 MG TABLET] 60 tablet 5    Sig: Take 1 tablet (12.5 mg total) by mouth 2 (two) times daily with a meal.     Cardiovascular:  Beta Blockers Failed - 11/09/2018  9:38 AM      Failed - Valid encounter within last 6 months    Recent Outpatient Visits          6 months ago Abnormal liver function tests   [facility] Meda CoffeeIrma M, MD   7 months ago Essential hypertension   [facility] Meda CoffeeIrma M, MD   1 year ago Acute non-recurrent sinusitis, unspecified location   [facility] Meda CoffeeIrma M, MD   1 year ago Acute non-recurrent maxillary sinusitis   [facility] Levell JulyEva N, MD   1 year ago Chronic tachycardia   [facility] Levell JulyEva N, MD             Passed - Last BP in normal range    BP Readings from Last 1 Encounters:  06/08/18 110/86         Passed - Last Heart Rate in normal range    Pulse Readings from Last 1 Encounters:  06/08/18 76        lisinopril-hydrochlorothiazide (PRINZIDE,ZESTORETIC) 20-25 MG tablet [Pharmacy Med Name: LISINOPRIL-HCTZ 20-25 MG TAB] 30 tablet 0    Sig: TAKE 1 TABLET BY MOUTH EVERY DAY     Cardiovascular:  ACEI + Diuretic Combos Failed - 11/09/2018  9:38 AM      Failed - Valid encounter within last 6 months    Recent Outpatient Visits          6 months ago Abnormal liver function tests   [facility], Meda CoffeeIrma M, MD   7 months ago Essential hypertension   [facility], Meda CoffeeIrma M, MD   1 year ago Acute non-recurrent sinusitis, unspecified location   [facility], Meda CoffeeIrma M, MD   1 year ago Acute non-recurrent maxillary sinusitis   [facility] Levell JulyEva N, MD   1 year ago Chronic tachycardia   [facility] Levell JulyEva N, MD             Passed - Na in normal range and within 180 days    Sodium  Date Value Ref Range Status  06/08/2018 136 135 - 145 mEq/L Final  04/15/2018 138 134 - 144 mmol/L Final         Passed - K in normal range and within 180 days    Potassium  Date Value Ref Range Status  06/08/2018 3.5 3.5 - 5.1 mEq/L Final         Passed - Cr in normal range and within 180 days    Creatinine, Ser  Date Value Ref Range Status  06/08/2018 0.75 0.40 - 1.20 mg/dL Final  Passed - Ca in normal range and within 180 days    Calcium  Date Value Ref Range Status  06/08/2018 9.1 8.4 - 10.5 mg/dL Final         Passed - Patient is not pregnant      Passed - Last BP in normal range    BP Readings from Last 1 Encounters:  06/08/18 110/86          Requested Prescriptions  Pending Prescriptions Disp Refills   carvedilol (COREG) 12.5 MG tablet [Pharmacy Med Name: CARVEDILOL 12.5 MG TABLET] 60 tablet 5    Sig: Take 1 tablet (12.5 mg total) by mouth 2 (two) times daily with a meal.     Cardiovascular:  Beta Blockers Failed - 11/09/2018  9:38 AM      Failed - Valid encounter within last 6 months    Recent Outpatient Visits          6 months ago Abnormal liver function tests   [facility], Meda Coffee, MD   7 months ago Essential hypertension   [facility], Meda Coffee, MD   1 year ago Acute non-recurrent sinusitis, unspecified location   [facility], Meda Coffee, MD   1 year ago Acute non-recurrent maxillary sinusitis   [facility], Levell July, MD   1 year ago Chronic tachycardia   [facility], Levell July, MD              Passed - Last BP in normal range    BP Readings from Last 1 Encounters:  06/08/18 110/86         Passed - Last Heart Rate in normal range    Pulse Readings from Last 1 Encounters:  06/08/18 76        lisinopril-hydrochlorothiazide (PRINZIDE,ZESTORETIC) 20-25 MG tablet [Pharmacy Med Name: LISINOPRIL-HCTZ 20-25 MG TAB] 30 tablet 0    Sig: TAKE 1 TABLET BY MOUTH EVERY DAY     Cardiovascular:  ACEI + Diuretic Combos Failed - 11/09/2018  9:38 AM      Failed - Valid encounter within last 6 months    Recent Outpatient Visits          6 months ago Abnormal liver function tests   [facility], Meda Coffee, MD   7 months ago Essential hypertension   [facility], Meda Coffee, MD   1 year ago Acute non-recurrent sinusitis, unspecified location   [facility], Meda Coffee, MD   1 year ago Acute non-recurrent maxillary sinusitis   [facility], Levell July, MD   1 year ago Chronic tachycardia   [facility], Levell July, MD             Passed - Na in normal range and within 180 days    Sodium  Date Value Ref Range Status  06/08/2018 136 135 - 145 mEq/L Final  04/15/2018 138 134 - 144 mmol/L Final         Passed - K in normal range and within 180 days    Potassium  Date Value Ref Range Status  06/08/2018 3.5 3.5 - 5.1 mEq/L Final         Passed - Cr in normal range and within 180 days    Creatinine, Ser  Date Value Ref Range Status  06/08/2018 0.75 0.40 - 1.20 mg/dL Final  Passed - Ca in normal range and within 180 days    Calcium  Date Value Ref Range Status  06/08/2018 9.1 8.4 - 10.5 mg/dL Final         Passed - Patient is not pregnant      Passed - Last BP in normal range    BP Readings from Last 1 Encounters:  06/08/18 110/86

## 2018-12-22 ENCOUNTER — Other Ambulatory Visit: Payer: Self-pay | Admitting: Family Medicine

## 2018-12-22 NOTE — Telephone Encounter (Signed)
Requested medication (s) are due for refill today - patient reports she has about 2 weeks left  Requested medication (s) are on the active medication list - yes  Future visit scheduled -yes  Last refill: 11/09/18  Notes to clinic: Patient is requesting refill of BP medication- she needs Follow up appointment- called patient and the office scheduled her. RF request sent for review.  Requested Prescriptions  Pending Prescriptions Disp Refills   carvedilol (COREG) 12.5 MG tablet [Pharmacy Med Name: CARVEDILOL 12.5 MG TABLET] 60 tablet 0    Sig: TAKE 1 TABLET (12.5 MG TOTAL) BY MOUTH 2 (TWO) TIMES DAILY WITH A MEAL.     Cardiovascular:  Beta Blockers Failed - 12/22/2018 12:18 PM      Failed - Valid encounter within last 6 months    Recent Outpatient Visits          8 months ago Abnormal liver function tests   [facility], Meda Coffee, MD   8 months ago Essential hypertension   [facility], Meda Coffee, MD   1 year ago Acute non-recurrent sinusitis, unspecified location   [facility], Meda Coffee, MD   1 year ago Acute non-recurrent maxillary sinusitis   [facility], Levell July, MD   2 years ago Chronic tachycardia   [facility], Levell July, MD             Passed - Last BP in normal range    BP Readings from Last 1 Encounters:  06/08/18 110/86         Passed - Last Heart Rate in normal range    Pulse Readings from Last 1 Encounters:  06/08/18 76        lisinopril-hydrochlorothiazide (ZESTORETIC) 20-25 MG tablet [Pharmacy Med Name: LISINOPRIL-HCTZ 20-25 MG TAB] 30 tablet 0    Sig: TAKE 1 TABLET BY MOUTH EVERY DAY     Cardiovascular:  ACEI + Diuretic Combos Failed - 12/22/2018 12:18 PM      Failed - Na in normal range and within 180 days    Sodium  Date Value Ref Range Status  06/08/2018 136 135 - 145 mEq/L Final  04/15/2018 138 134 - 144 mmol/L Final         Failed - K in normal range and within 180  days    Potassium  Date Value Ref Range Status  06/08/2018 3.5 3.5 - 5.1 mEq/L Final         Failed - Cr in normal range and within 180 days    Creatinine, Ser  Date Value Ref Range Status  06/08/2018 0.75 0.40 - 1.20 mg/dL Final         Failed - Ca in normal range and within 180 days    Calcium  Date Value Ref Range Status  06/08/2018 9.1 8.4 - 10.5 mg/dL Final         Failed - Valid encounter within last 6 months    Recent Outpatient Visits          8 months ago Abnormal liver function tests   [facility], Meda Coffee, MD   8 months ago Essential hypertension   [facility], Meda Coffee, MD   1 year ago Acute non-recurrent sinusitis, unspecified location   [facility], Meda Coffee, MD   1 year ago Acute non-recurrent maxillary sinusitis   [facility] at [facility],  Levell July, MD   2 years ago Chronic tachycardia   [facility], Levell July, MD             Passed - Patient is not pregnant      Passed - Last BP in normal range    BP Readings from Last 1 Encounters:  06/08/18 110/86          Requested Prescriptions  Pending Prescriptions Disp Refills   carvedilol (COREG) 12.5 MG tablet [Pharmacy Med Name: CARVEDILOL 12.5 MG TABLET] 60 tablet 0    Sig: TAKE 1 TABLET (12.5 MG TOTAL) BY MOUTH 2 (TWO) TIMES DAILY WITH A MEAL.     Cardiovascular:  Beta Blockers Failed - 12/22/2018 12:18 PM      Failed - Valid encounter within last 6 months    Recent Outpatient Visits          8 months ago Abnormal liver function tests   [facility], Meda Coffee, MD   8 months ago Essential hypertension   [facility], Meda Coffee, MD   1 year ago Acute non-recurrent sinusitis, unspecified location   [facility], Meda Coffee, MD   1 year ago Acute non-recurrent maxillary sinusitis   [facility], Levell July, MD   2 years ago Chronic tachycardia   [facility], Levell July, MD             Passed - Last BP in normal range    BP Readings from Last 1 Encounters:  06/08/18 110/86         Passed - Last Heart Rate in normal range    Pulse Readings from Last 1 Encounters:  06/08/18 76        lisinopril-hydrochlorothiazide (ZESTORETIC) 20-25 MG tablet [Pharmacy Med Name: LISINOPRIL-HCTZ 20-25 MG TAB] 30 tablet 0    Sig: TAKE 1 TABLET BY MOUTH EVERY DAY     Cardiovascular:  ACEI + Diuretic Combos Failed - 12/22/2018 12:18 PM      Failed - Na in normal range and within 180 days    Sodium  Date Value Ref Range Status  06/08/2018 136 135 - 145 mEq/L Final  04/15/2018 138 134 - 144 mmol/L Final         Failed - K in normal range and within 180 days    Potassium  Date Value Ref Range Status  06/08/2018 3.5 3.5 - 5.1 mEq/L Final         Failed - Cr in normal range and within 180 days    Creatinine, Ser  Date Value Ref Range Status  06/08/2018 0.75 0.40 - 1.20 mg/dL Final         Failed - Ca in normal range and within 180 days    Calcium  Date Value Ref Range Status  06/08/2018 9.1 8.4 - 10.5 mg/dL Final         Failed - Valid encounter within last 6 months    Recent Outpatient Visits          8 months ago Abnormal liver function tests   [facility], Meda Coffee, MD   8 months ago Essential hypertension   [facility], Meda Coffee, MD   1 year ago Acute non-recurrent sinusitis, unspecified location   [facility], Meda Coffee, MD   1 year ago Acute non-recurrent maxillary sinusitis   [facility], Levell JulyEva N, MD   2 years ago Chronic tachycardia   [facility], Levell JulyEva N, MD             Passed - Patient is not pregnant      Passed - Last BP in normal range    BP Readings from Last 1 Encounters:  06/08/18 110/86

## 2018-12-23 ENCOUNTER — Other Ambulatory Visit: Payer: Self-pay | Admitting: Family Medicine

## 2018-12-28 ENCOUNTER — Telehealth (INDEPENDENT_AMBULATORY_CARE_PROVIDER_SITE_OTHER): Payer: 59 | Admitting: Family Medicine

## 2018-12-28 ENCOUNTER — Other Ambulatory Visit: Payer: Self-pay

## 2018-12-28 DIAGNOSIS — K219 Gastro-esophageal reflux disease without esophagitis: Secondary | ICD-10-CM

## 2018-12-28 DIAGNOSIS — R945 Abnormal results of liver function studies: Secondary | ICD-10-CM

## 2018-12-28 DIAGNOSIS — I10 Essential (primary) hypertension: Secondary | ICD-10-CM

## 2018-12-28 DIAGNOSIS — R7989 Other specified abnormal findings of blood chemistry: Secondary | ICD-10-CM

## 2018-12-28 DIAGNOSIS — Z6832 Body mass index (BMI) 32.0-32.9, adult: Secondary | ICD-10-CM

## 2018-12-28 MED ORDER — LISINOPRIL-HYDROCHLOROTHIAZIDE 20-25 MG PO TABS: 1.0000 | ORAL_TABLET | Freq: Every day | ORAL | 1 refills | Status: DC

## 2018-12-28 MED ORDER — CARVEDILOL 12.5 MG PO TABS: 12.5000 mg | ORAL_TABLET | Freq: Two times a day (BID) | ORAL | 1 refills | Status: DC

## 2018-12-28 NOTE — Progress Notes (Signed)
Needing refill on pending medication. Know that she should fill out consent form for gyn notes

## 2018-12-28 NOTE — Progress Notes (Signed)
Virtual Visit Note  I connected with patient on 12/28/18 at 407 by webex and verified that I am speaking with the correct person using two identifiers. Julie Lucas is currently located at home and patient is currently with them during visit. The provider, Rutherford Guys, MD is located in their office at time of visit.  I discussed the limitations, risks, security and privacy concerns of performing an evaluation and management service by telephone and the availability of in person appointments. I also discussed with the patient that there may be a patient responsible charge related to this service. The patient expressed understanding and agreed to proceed.   CC: followup of BP  HPI ? Patient is a 38 y.o. female with past medical history significant for HTN, seasonal allergies, GERD who presents today for routine followup  Last OV Aug 2019 Chart review Sent to GI for abnormal LFTs Workup completely normal x for mild elevation of antismooth muscle Repeat LFT normalized Lab Results  Component Value Date   ALT 23 06/08/2018   AST 15 06/08/2018   ALKPHOS 57 06/08/2018   BILITOT 0.6 06/08/2018    She reports that she is overall doing well She has been trying to eat healthier Has not been exercising She reports weight has been stable Checks BP at home, needs to get batteries gerd has been well controlled, not taking daily meds, will take TUMS prn Saw gyn April, BP there 130/70   Allergies  Allergen Reactions  . Augmentin [Amoxicillin-Pot Clavulanate] Nausea And Vomiting  . Sulfa Antibiotics Hives    Prior to Admission medications   Medication Sig Start Date End Date Taking? Authorizing Provider  carvedilol (COREG) 12.5 MG tablet TAKE 1 TABLET (12.5 MG TOTAL) BY MOUTH 2 (TWO) TIMES DAILY WITH A MEAL. 12/22/18   Rutherford Guys, MD  cetirizine (ZYRTEC) 10 MG tablet Take 10 mg by mouth daily.    [provider]  fluticasone (FLONASE) 50 MCG/ACT nasal spray  Place 1 spray into both nostrils 2 (two) times daily. 09/03/17   Rutherford Guys, MD  levonorgestrel-ethinyl estradiol (SEASONALE,INTROVALE,JOLESSA) 0.15-0.03 MG tablet Take 1 tablet by mouth daily.    [provider]  omeprazole (PRILOSEC) 20 MG capsule Take 1 capsule (20 mg total) by mouth daily. 03/30/18   Rutherford Guys, MD  ranitidine (ZANTAC) 15 MG/ML syrup Take by mouth 2 (two) times daily.    [provider]    Past Medical History:  Diagnosis Date  . Allergic urticaria 03/05/2016  . Essential hypertension 11/19/2016  . Hypertension   . Inappropriate sinus tachycardia 11/19/2016    Past Surgical History:  Procedure Laterality Date  . thumb surgery      Social History   Tobacco Use  . Smoking status: Never Smoker  . Smokeless tobacco: Never Used  Substance Use Topics  . Alcohol use: Yes    Comment: occasionally    Family History  Problem Relation Age of Onset  . Allergic rhinitis Mother   . Hypertension Mother   . Allergic rhinitis Father   . Hypertension Father   . Hypertension Brother   . Diabetes Maternal Grandfather   . Diabetes Paternal Grandfather   . Angioedema Neg Hx   . Asthma Neg Hx   . Eczema Neg Hx   . Immunodeficiency Neg Hx   . Urticaria Neg Hx     Review of Systems  Constitutional: Negative for chills and fever.  Respiratory: Negative for cough and shortness of breath.  Cardiovascular: Negative for chest pain, palpitations and leg swelling.  Gastrointestinal: Negative for abdominal pain, nausea and vomiting.    Objective  Vitals as reported by the patient: per above   ASSESSMENT and PLAN  1. Essential hypertension - Lipid panel; Future - TSH; Future - CMP14+EGFR; Future - carvedilol (COREG) 12.5 MG tablet; Take 1 tablet (12.5 mg total) by mouth 2 (two) times daily with a meal. - lisinopril-hydrochlorothiazide (ZESTORETIC) 20-25 MG tablet; Take 1 tablet by mouth daily.  2. BMI 32.0 - 32.9 Continue with LFM,  discussed weight loss programs  3. Abnormal liver function tests Per last test normalized. Rechecking again.  4. GERD Mostly resolved, very intermittent flareups that she manages with tums  FOLLOW-UP: 6 months   The above assessment and management plan was discussed with the patient. The patient verbalized understanding of and has agreed to the management plan. Patient is aware to call the clinic if symptoms persist or worsen. Patient is aware when to return to the clinic for a follow-up visit. Patient educated on when it is appropriate to go to the emergency department.    I provided 15 minutes of non-face-to-face time during this encounter.  Rutherford Guys, MD Primary [facility] at [facility] [place], [place] [address] Ph.  [phone] Fax [phone]

## 2019-01-31 NOTE — Telephone Encounter (Signed)
Been seen by Dr. Pamella Pert 12/2018

## 2019-04-04 ENCOUNTER — Telehealth: Payer: Self-pay | Admitting: Cardiovascular Disease

## 2019-04-04 NOTE — Telephone Encounter (Signed)
Recall -  lmtcb 04-04-19 °

## 2019-04-12 IMAGING — US US ABDOMEN LIMITED
1 series · 14 of 25 positions shown · non-contrast
Comparison: None.

CLINICAL DATA: Elevated liver function tests.

EXAM:
ULTRASOUND ABDOMEN LIMITED RIGHT UPPER QUADRANT

[Series 1: us abdomen limited · 0.15mm/px · 14 of 41 slices shown]
[im 1/41]
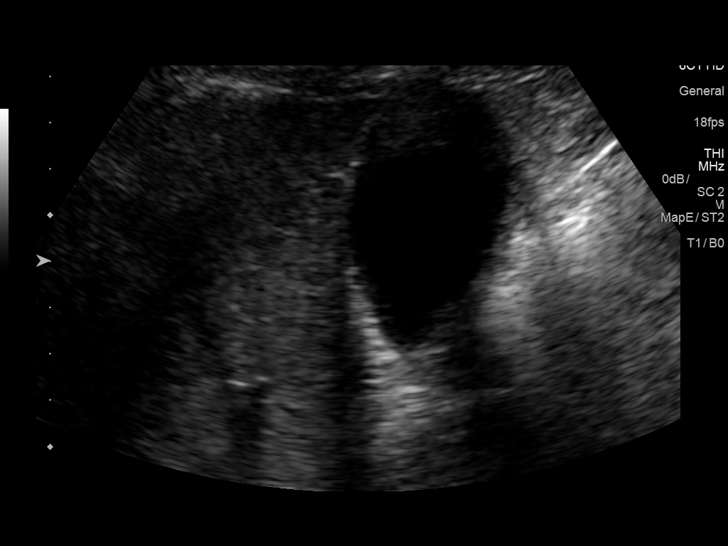
[im 4/41]
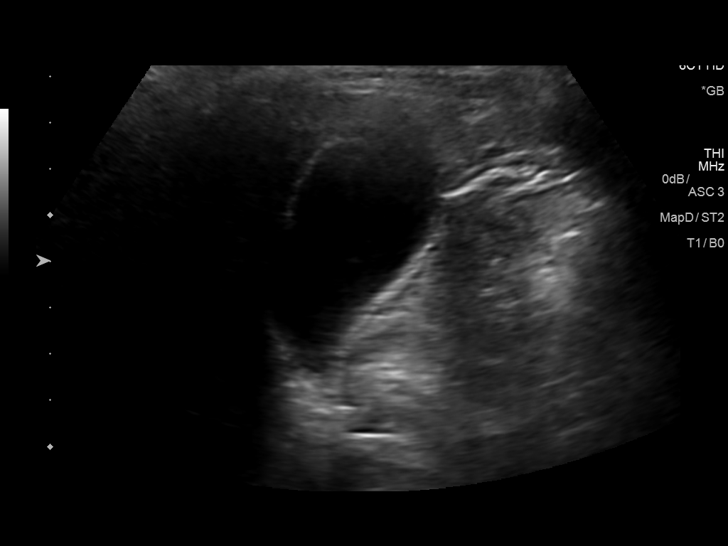
[im 7/41]
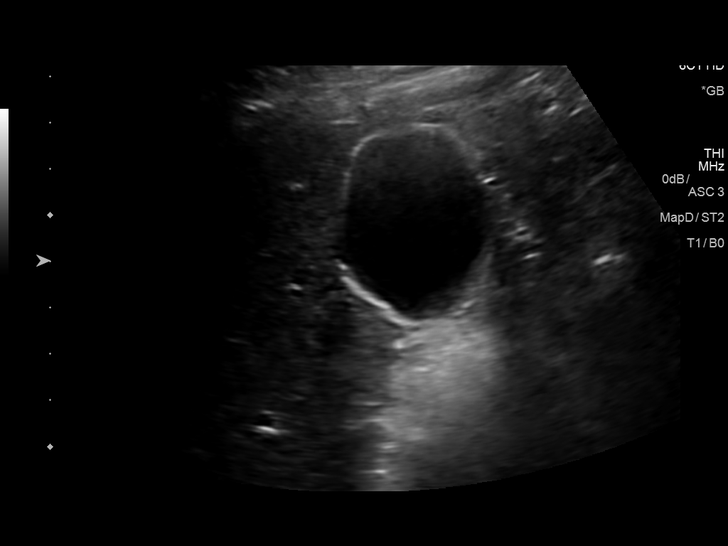
[im 11/41]
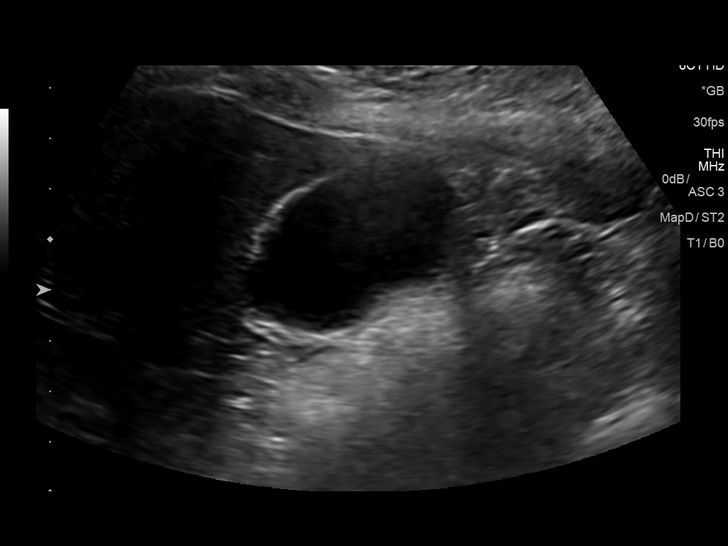
[im 14/41]
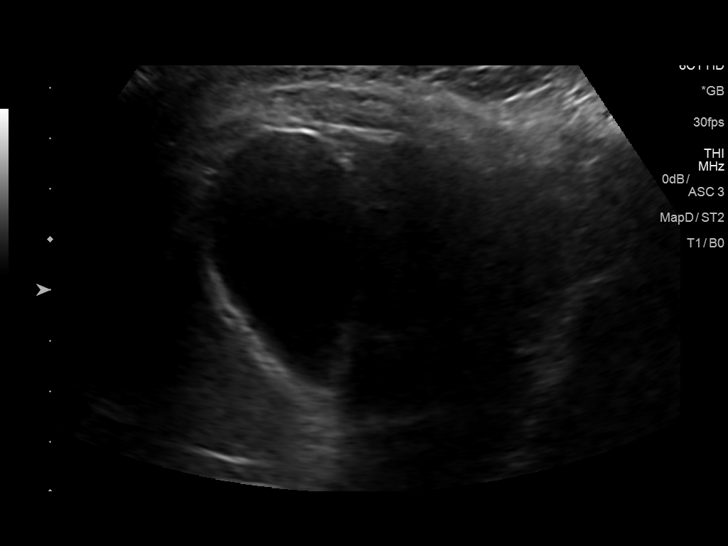
[im 16/41]
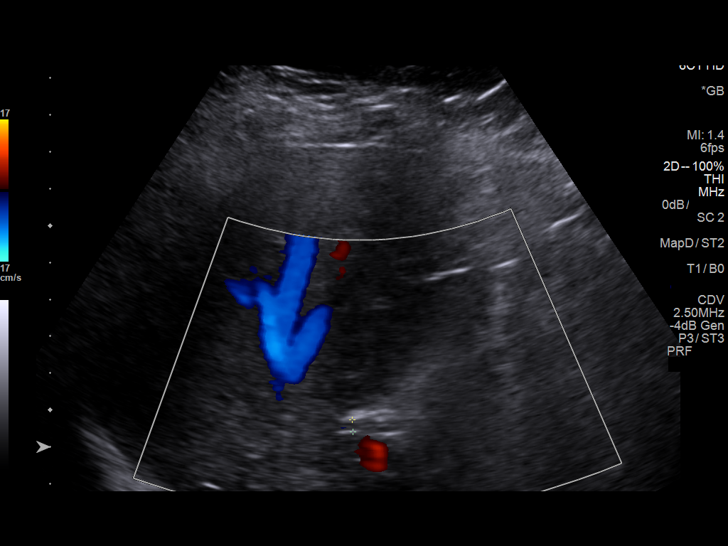
[im 19/41]
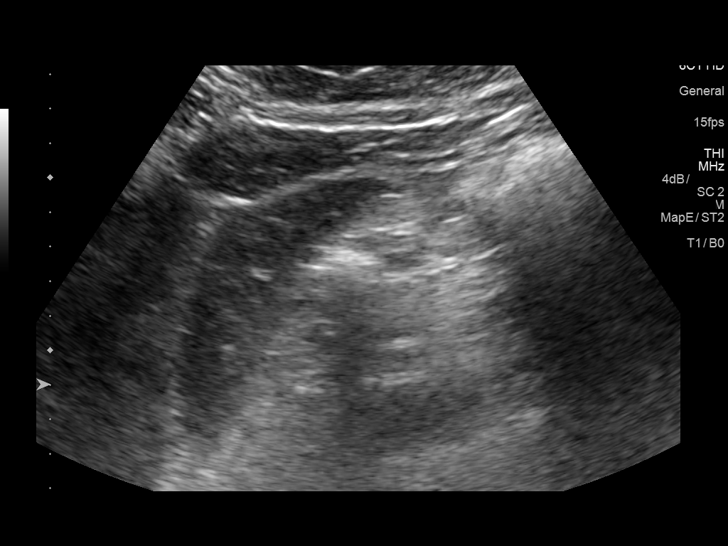
[im 22/41]
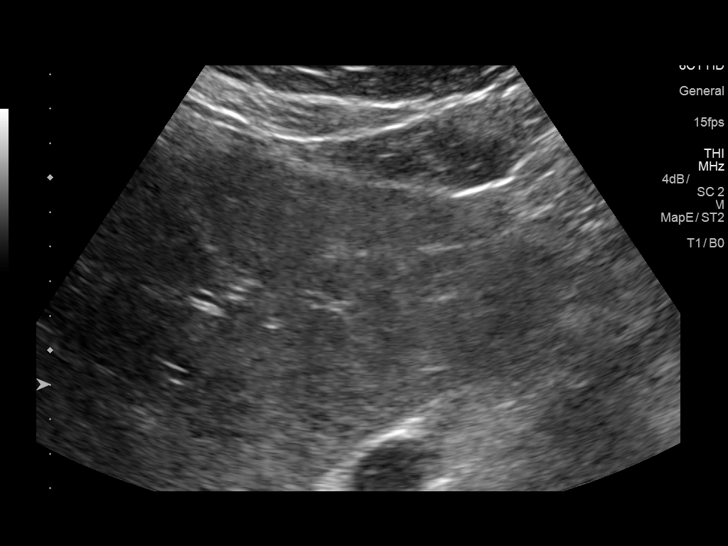
[im 26/41]
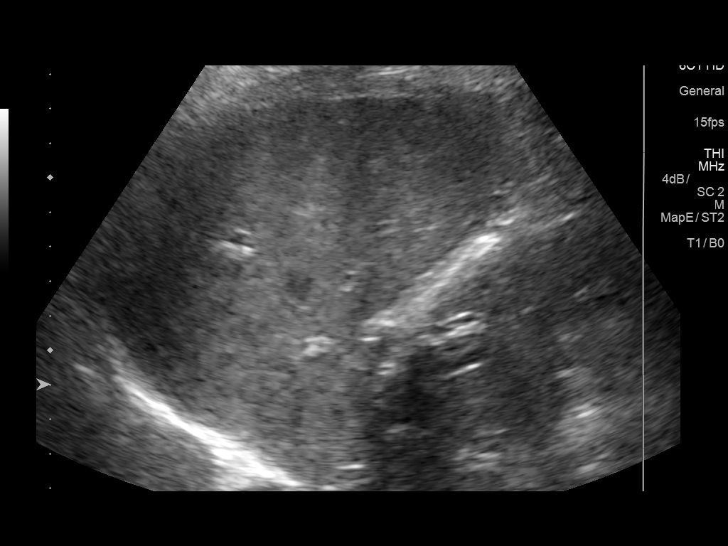
[im 27/41]
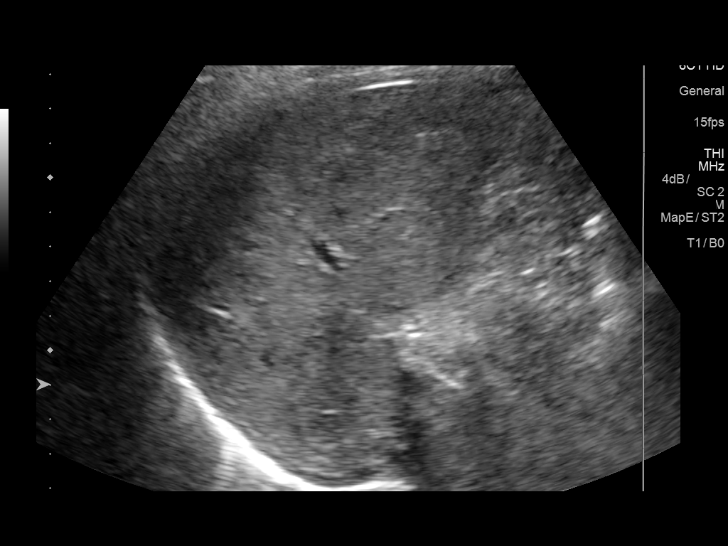
[im 31/41]
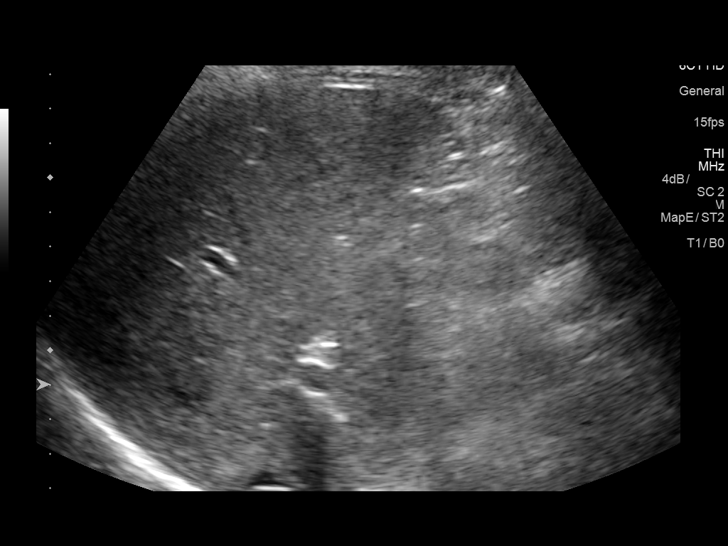
[im 34/41]
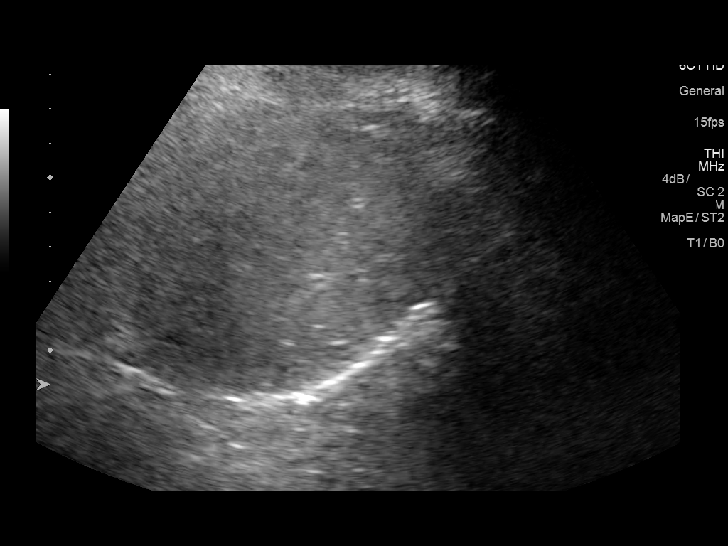
[im 37/41]
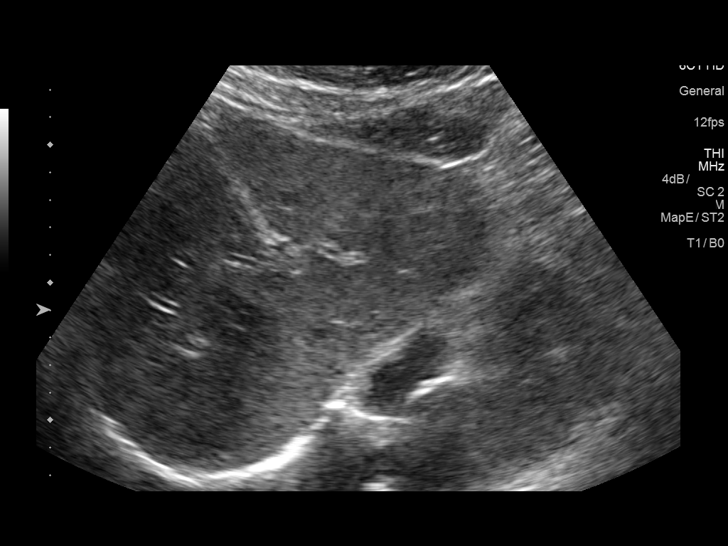
[im 41/41]
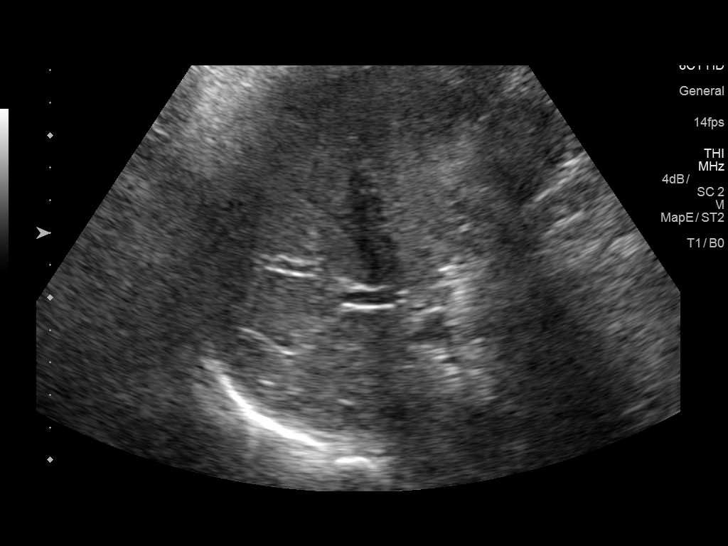

[14 of 25 positions shown; findings below may reference images not displayed]

FINDINGS: Gallbladder:

No gallstones or wall thickening visualized. No sonographic Murphy
sign noted by sonographer.

Common bile duct:

Diameter: Normal, 3 mm.

Liver:

No focal lesion identified. Within normal limits in parenchymal
echogenicity. Portal vein is patent on color Doppler imaging with
normal direction of blood flow towards the liver.
IMPRESSION: No acute process or explanation for elevated liver function tests.

## 2019-07-17 ENCOUNTER — Telehealth (INDEPENDENT_AMBULATORY_CARE_PROVIDER_SITE_OTHER): Payer: 59 | Admitting: Family Medicine

## 2019-07-17 ENCOUNTER — Other Ambulatory Visit: Payer: Self-pay

## 2019-07-17 ENCOUNTER — Other Ambulatory Visit: Payer: Self-pay | Admitting: Family Medicine

## 2019-07-17 DIAGNOSIS — I10 Essential (primary) hypertension: Secondary | ICD-10-CM

## 2019-07-17 DIAGNOSIS — J3089 Other allergic rhinitis: Secondary | ICD-10-CM | POA: Diagnosis not present

## 2019-07-17 MED ORDER — LISINOPRIL-HYDROCHLOROTHIAZIDE 20-25 MG PO TABS: 1.0000 | ORAL_TABLET | Freq: Every day | ORAL | 1 refills | Status: DC

## 2019-07-17 MED ORDER — CARVEDILOL 12.5 MG PO TABS: 12.5000 mg | ORAL_TABLET | Freq: Two times a day (BID) | ORAL | 1 refills | Status: DC

## 2019-07-17 NOTE — Progress Notes (Signed)
Virtual Visit Note  I connected with patient on 07/17/19 at 590pm by video doxyme and verified that I am speaking with the correct person using two identifiers. Julie Lucas is currently located at home and patient is currently with them during visit. The provider, Rutherford Guys, MD is located in their office at time of visit.  I discussed the limitations, risks, security and privacy concerns of performing an evaluation and management service by telephone and the availability of in person appointments. I also discussed with the patient that there may be a patient responsible charge related to this service. The patient expressed understanding and agreed to proceed.   CC: routine followup  HPI  Last OV may 2020 - telemedicine Has been doing well No cp, palpitation, sob, edema Check bp at home? Reports stable weight Has been working from home since march, coping well  Allergies not well controlled Taking nasacort 1 spray once a day Zyrtec nor allegra help Still having itchy eyes, itchy throat, sneezing Some PND No asthma  pap/bc thru gyn  Allergies  Allergen Reactions  . Augmentin [Amoxicillin-Pot Clavulanate] Nausea And Vomiting  . Sulfa Antibiotics Hives    Prior to Admission medications   Medication Sig Start Date End Date Taking? Authorizing Provider  carvedilol (COREG) 12.5 MG tablet Take 1 tablet (12.5 mg total) by mouth 2 (two) times daily with a meal. 12/28/18  Yes Rutherford Guys, MD  cetirizine (ZYRTEC) 10 MG tablet Take 10 mg by mouth daily.   Yes [provider]  fluticasone (FLONASE) 50 MCG/ACT nasal spray Place 1 spray into both nostrils 2 (two) times daily. 09/03/17  Yes Rutherford Guys, MD  levonorgestrel-ethinyl estradiol (SEASONALE,INTROVALE,JOLESSA) 0.15-0.03 MG tablet Take 1 tablet by mouth daily.   Yes [provider]  lisinopril-hydrochlorothiazide (ZESTORETIC) 20-25 MG tablet Take 1 tablet by mouth daily. 12/28/18  Yes Rutherford Guys, MD    Past Medical History:  Diagnosis Date  . Allergic urticaria 03/05/2016  . Essential hypertension 11/19/2016  . Hypertension   . Inappropriate sinus tachycardia 11/19/2016    Past Surgical History:  Procedure Laterality Date  . thumb surgery      Social History   Tobacco Use  . Smoking status: Never Smoker  . Smokeless tobacco: Never Used  Substance Use Topics  . Alcohol use: Yes    Comment: occasionally    Family History  Problem Relation Age of Onset  . Allergic rhinitis Mother   . Hypertension Mother   . Allergic rhinitis Father   . Hypertension Father   . Hypertension Brother   . Diabetes Maternal Grandfather   . Diabetes Paternal Grandfather   . Angioedema Neg Hx   . Asthma Neg Hx   . Eczema Neg Hx   . Immunodeficiency Neg Hx   . Urticaria Neg Hx     ROS Per hpi  Objective  Vitals as reported by the patient: 107/73 116/81 110/80 117/79   Patient is alert and oriented x3, in no acute distress She is speaking in full sentences, breathing very comfortably  ASSESSMENT and PLAN  1. Essential hypertension Controlled. Continue current regime. Fasting CMP and lipid ordered. - carvedilol (COREG) 12.5 MG tablet; Take 1 tablet (12.5 mg total) by mouth 2 (two) times daily with a meal. - lisinopril-hydrochlorothiazide (ZESTORETIC) 20-25 MG tablet; Take 1 tablet by mouth daily.  2. Allergic rhinitis Not controlled. Discussed increasing nasacort to max dose. Trial of xyzal. Consider adding singulair.   FOLLOW-UP: 6 months  The above assessment and management plan was discussed with the patient. The patient verbalized understanding of and has agreed to the management plan. Patient is aware to call the clinic if symptoms persist or worsen. Patient is aware when to return to the clinic for a follow-up visit. Patient educated on when it is appropriate to go to the emergency department.    I provided 9 minutes of non-face-to-face time during this  encounter.  Myles Lipps, MD Primary Care at [facility] [address] [place], [place] [address] Ph.  [phone] Fax [phone]

## 2019-07-17 NOTE — Progress Notes (Signed)
Needing refill on pended meds and wants to talk about an alternative to flonase. Says she feels she starting to be immune to it not working as well. She will be making an appt  for gyn soo

## 2019-07-23 ENCOUNTER — Encounter: Payer: Self-pay | Admitting: Family Medicine

## 2019-09-06 ENCOUNTER — Telehealth: Payer: Managed Care, Other (non HMO) | Admitting: Registered Nurse

## 2019-09-06 ENCOUNTER — Other Ambulatory Visit: Payer: Self-pay

## 2019-10-28 ENCOUNTER — Encounter: Payer: Self-pay | Admitting: Family Medicine

## 2019-11-04 ENCOUNTER — Ambulatory Visit: Payer: 59 | Attending: Internal Medicine

## 2019-11-04 DIAGNOSIS — Z23 Encounter for immunization: Secondary | ICD-10-CM

## 2019-11-04 NOTE — Progress Notes (Signed)
   Covid-19 Vaccination Clinic  Name:  Julie Lucas    MRN: [id number] DOB: 22-May-1981  11/04/2019  Julie Lucas was observed post Covid-19 immunization for 15 minutes without incident. She was provided with Vaccine Information Sheet and instruction to access the V-Safe system.   Julie Lucas was instructed to call 911 with any severe reactions post vaccine: Marland Kitchen Difficulty breathing  . Swelling of face and throat  . A fast heartbeat  . A bad rash all over body  . Dizziness and weakness   Immunizations Administered    Name Date Dose VIS Date Route   Pfizer COVID-19 Vaccine 11/04/2019  9:48 AM 0.3 mL 08/04/2019 Intramuscular   Manufacturer: [employer], [employer]   Lot: [id number]   NDC: [address]-6

## 2019-11-27 ENCOUNTER — Ambulatory Visit: Payer: 59 | Attending: Internal Medicine

## 2019-11-27 ENCOUNTER — Ambulatory Visit: Payer: 59

## 2019-11-27 DIAGNOSIS — Z23 Encounter for immunization: Secondary | ICD-10-CM

## 2019-11-27 NOTE — Progress Notes (Signed)
   Covid-19 Vaccination Clinic  Name:  Julie Lucas    MRN: [id number] DOB: December 08, 1980  11/27/2019  [name] was observed post Covid-19 immunization for 15 minutes without incident. She was provided with Vaccine Information Sheet and instruction to access the V-Safe system.   [name] was instructed to call 911 with any severe reactions post vaccine: Marland Kitchen Difficulty breathing  . Swelling of face and throat  . A fast heartbeat  . A bad rash all over body  . Dizziness and weakness   Immunizations Administered    Name Date Dose VIS Date Route   Pfizer COVID-19 Vaccine 11/27/2019 10:51 AM 0.3 mL 08/04/2019 Intramuscular   Manufacturer: [employer], [employer]   Lot: [id number]   NDC: [address]-7

## 2020-01-01 LAB — HM PAP SMEAR: HM Pap smear: NORMAL

## 2020-01-08 ENCOUNTER — Other Ambulatory Visit: Payer: Self-pay

## 2020-01-08 ENCOUNTER — Ambulatory Visit (INDEPENDENT_AMBULATORY_CARE_PROVIDER_SITE_OTHER): Payer: 59 | Admitting: Family Medicine

## 2020-01-08 DIAGNOSIS — I10 Essential (primary) hypertension: Secondary | ICD-10-CM

## 2020-01-08 NOTE — Progress Notes (Signed)
Lab only visit 

## 2020-01-09 ENCOUNTER — Encounter: Payer: Self-pay | Admitting: Family Medicine

## 2020-01-09 ENCOUNTER — Encounter: Payer: Self-pay | Admitting: General Practice

## 2020-01-09 ENCOUNTER — Telehealth (INDEPENDENT_AMBULATORY_CARE_PROVIDER_SITE_OTHER): Payer: 59 | Admitting: Family Medicine

## 2020-01-09 ENCOUNTER — Other Ambulatory Visit: Payer: Self-pay

## 2020-01-09 VITALS — Wt 190.0 lb

## 2020-01-09 DIAGNOSIS — R945 Abnormal results of liver function studies: Secondary | ICD-10-CM | POA: Diagnosis not present

## 2020-01-09 DIAGNOSIS — I10 Essential (primary) hypertension: Secondary | ICD-10-CM | POA: Diagnosis not present

## 2020-01-09 DIAGNOSIS — R7989 Other specified abnormal findings of blood chemistry: Secondary | ICD-10-CM

## 2020-01-09 LAB — LIPID PANEL
Chol/HDL Ratio: 4 ratio (ref 0.0–4.4)
Cholesterol, Total: 192 mg/dL (ref 100–199)
HDL: 48 mg/dL (ref >39)
LDL Chol Calc (NIH): 115 mg/dL — ABNORMAL HIGH (ref 0–99)
Triglycerides: 162 mg/dL — ABNORMAL HIGH (ref 0–149)
VLDL Cholesterol Cal: 29 mg/dL (ref 5–40)

## 2020-01-09 LAB — COMPREHENSIVE METABOLIC PANEL
ALT: 224 IU/L — ABNORMAL HIGH (ref 0–32)
AST: 119 IU/L — ABNORMAL HIGH (ref 0–40)
Albumin/Globulin Ratio: 1.5 (ref 1.2–2.2)
Albumin: 4.4 g/dL (ref 3.8–4.8)
Alkaline Phosphatase: 67 IU/L (ref 48–121)
BUN/Creatinine Ratio: 10 (ref 9–23)
BUN: 8 mg/dL (ref 6–20)
Bilirubin Total: 0.8 mg/dL (ref 0.0–1.2)
CO2: 23 mmol/L (ref 20–29)
Calcium: 10.1 mg/dL (ref 8.7–10.2)
Chloride: 101 mmol/L (ref 96–106)
Creatinine, Ser: 0.83 mg/dL (ref 0.57–1.00)
GFR calc Af Amer: 105 mL/min/{1.73_m2} (ref >59)
GFR calc non Af Amer: 91 mL/min/{1.73_m2} (ref >59)
Globulin, Total: 2.9 g/dL (ref 1.5–4.5)
Glucose: 86 mg/dL (ref 65–99)
Potassium: 4.3 mmol/L (ref 3.5–5.2)
Sodium: 137 mmol/L (ref 134–144)
Total Protein: 7.3 g/dL (ref 6.0–8.5)

## 2020-01-09 MED ORDER — CARVEDILOL 12.5 MG PO TABS: 12.5000 mg | ORAL_TABLET | Freq: Two times a day (BID) | ORAL | 1 refills | Status: DC

## 2020-01-09 MED ORDER — LISINOPRIL-HYDROCHLOROTHIAZIDE 20-25 MG PO TABS: 1.0000 | ORAL_TABLET | Freq: Every day | ORAL | 1 refills | Status: DC

## 2020-01-09 NOTE — Patient Instructions (Signed)
° ° ° °  If you have lab work done today you will be contacted with your lab results within the next 2 weeks.  If you have not heard from us then please contact us. The fastest way to get your results is to register for My Chart. ° ° °IF you received an x-ray today, you will receive an invoice from [facility]. Please contact [facility] at [phone] with questions or concerns regarding your invoice.  ° °IF you received labwork today, you will receive an invoice from [facility]. Please contact [facility] at [phone] with questions or concerns regarding your invoice.  ° °Our billing staff will not be able to assist you with questions regarding bills from these companies. ° °You will be contacted with the lab results as soon as they are available. The fastest way to get your results is to activate your My Chart account. Instructions are located on the last page of this paperwork. If you have not heard from us regarding the results in 2 weeks, please contact this office. °  ° ° ° °

## 2020-01-09 NOTE — Progress Notes (Signed)
Virtual Visit Note  I connected with patient on 01/09/20 at 518pm by video epic and verified that I am speaking with the correct person using two identifiers. Julie Lucas is currently located at home and patient is currently with them during visit. The provider, Rutherford Guys, MD is located in their office at time of visit.  I discussed the limitations, risks, security and privacy concerns of performing an evaluation and management service by telephone and the availability of in person appointments. I also discussed with the patient that there may be a patient responsible charge related to this service. The patient expressed understanding and agreed to proceed.   I provided 13 minutes of non-face-to-face time during this encounter.  Chief Complaint  Patient presents with  . Medication Refill    needing bp meds, pt goes to gyn at [facility] . Had pcp last weekk, will sign consent    HPI ? She is overall doing well and has no acute concerns today Working on diet and exercise Cutting back on fast food and sodas Starting to walk on treadmill daily Had labs done yesterday  Lab Results  Component Value Date   ALT 224 (H) 01/08/2020   AST 119 (H) 01/08/2020   ALKPHOS 67 01/08/2020   BILITOT 0.8 01/08/2020   Lab Results  Component Value Date   CREATININE 0.83 01/08/2020   BUN 8 01/08/2020   NA 137 01/08/2020   K 4.3 01/08/2020   CL 101 01/08/2020   CO2 23 01/08/2020   Lab Results  Component Value Date   CHOL 192 01/08/2020   HDL 48 01/08/2020   LDLCALC 115 (H) 01/08/2020   TRIG 162 (H) 01/08/2020   CHOLHDL 4.0 01/08/2020   Workup in 2019 for abnormal done by GI was negative [place] normal.   Allergies  Allergen Reactions  . Augmentin [Amoxicillin-Pot Clavulanate] Nausea And Vomiting  . Sulfa Antibiotics Hives    Prior to Admission medications   Medication Sig Start Date End Date Taking? Authorizing Provider  carvedilol (COREG) 12.5 MG tablet Take 1  tablet (12.5 mg total) by mouth 2 (two) times daily with a meal. 07/17/19  Yes Rutherford Guys, MD  cetirizine (ZYRTEC) 10 MG tablet Take 10 mg by mouth daily.   Yes [provider]  fluticasone (FLONASE) 50 MCG/ACT nasal spray Place 1 spray into both nostrils 2 (two) times daily. 09/03/17  Yes Rutherford Guys, MD  levonorgestrel-ethinyl estradiol (SEASONALE,INTROVALE,JOLESSA) 0.15-0.03 MG tablet Take 1 tablet by mouth daily.   Yes [provider]  lisinopril-hydrochlorothiazide (ZESTORETIC) 20-25 MG tablet Take 1 tablet by mouth daily. 07/17/19  Yes Rutherford Guys, MD    Past Medical History:  Diagnosis Date  . Allergic urticaria 03/05/2016  . Essential hypertension 11/19/2016  . Hypertension   . Inappropriate sinus tachycardia 11/19/2016    Past Surgical History:  Procedure Laterality Date  . thumb surgery      Social History   Tobacco Use  . Smoking status: Never Smoker  . Smokeless tobacco: Never Used  Substance Use Topics  . Alcohol use: Yes    Comment: occasionally    Family History  Problem Relation Age of Onset  . Allergic rhinitis Mother   . Hypertension Mother   . Allergic rhinitis Father   . Hypertension Father   . Hypertension Brother   . Diabetes Maternal Grandfather   . Diabetes Paternal Grandfather   . Angioedema Neg Hx   . Asthma Neg Hx   .  Eczema Neg Hx   . Immunodeficiency Neg Hx   . Urticaria Neg Hx     Review of Systems  Constitutional: Negative for chills and fever.  Respiratory: Negative for cough and shortness of breath.   Cardiovascular: Negative for chest pain, palpitations and leg swelling.  Gastrointestinal: Negative for abdominal pain, nausea and vomiting.    Objective  Vitals as reported by the patient: BP 128/91 (4/6), 118/88 (4/11), 118/76 (4/21), 118/79 (5/4), 110/78 (5/10)  Wt Readings from Last 3 Encounters:  01/09/20 190 lb (86.2 kg)  06/08/18 194 lb (88 kg)  03/30/18 190 lb 12.8 oz (86.5 kg)   GEN:  AAOx3, NAD HEENT: [place]/AT, pupils are symmetrical, EOMI, non-icteric sclera Resp: breathing comfortably, speaking in full sentences Skin: no rashes noted, no pallor Psych: good eye contact, normal mood and affect  ASSESSMENT and PLAN  1. Essential hypertension Controlled. Continue current regime. Continue LFM - lisinopril-hydrochlorothiazide (ZESTORETIC) 20-25 MG tablet; Take 1 tablet by mouth daily. - carvedilol (COREG) 12.5 MG tablet; Take 1 tablet (12.5 mg total) by mouth 2 (two) times daily with a meal.  2. Abnormal liver function tests Neg workup in 2019, discussed discussing changing contraception with obgyn as she is on oral estrogen. Recheck labs in 4 weeks after change in birthcontrol. Continue to monitor.   FOLLOW-UP: 6 months   The above assessment and management plan was discussed with the patient. The patient verbalized understanding of and has agreed to the management plan. Patient is aware to call the clinic if symptoms persist or worsen. Patient is aware when to return to the clinic for a follow-up visit. Patient educated on when it is appropriate to go to the emergency department.     Myles Lipps, MD Primary Care at [facility] [address] [place], [place] [address] Ph.  [phone] Fax [phone]

## 2020-01-12 ENCOUNTER — Telehealth: Payer: Managed Care, Other (non HMO) | Admitting: Family Medicine

## 2020-05-07 ENCOUNTER — Telehealth: Payer: 59 | Admitting: Family Medicine

## 2020-05-17 ENCOUNTER — Telehealth: Payer: 59 | Admitting: Family Medicine

## 2020-05-23 ENCOUNTER — Ambulatory Visit (INDEPENDENT_AMBULATORY_CARE_PROVIDER_SITE_OTHER): Payer: 59 | Admitting: Family Medicine

## 2020-05-23 ENCOUNTER — Encounter: Payer: Self-pay | Admitting: Family Medicine

## 2020-05-23 ENCOUNTER — Telehealth (INDEPENDENT_AMBULATORY_CARE_PROVIDER_SITE_OTHER): Payer: 59 | Admitting: Family Medicine

## 2020-05-23 ENCOUNTER — Other Ambulatory Visit: Payer: Self-pay

## 2020-05-23 DIAGNOSIS — R945 Abnormal results of liver function studies: Secondary | ICD-10-CM

## 2020-05-23 DIAGNOSIS — R7989 Other specified abnormal findings of blood chemistry: Secondary | ICD-10-CM

## 2020-05-23 DIAGNOSIS — I10 Essential (primary) hypertension: Secondary | ICD-10-CM | POA: Diagnosis not present

## 2020-05-23 MED ORDER — CARVEDILOL 12.5 MG PO TABS: 12.5000 mg | ORAL_TABLET | Freq: Two times a day (BID) | ORAL | 1 refills | Status: DC

## 2020-05-23 MED ORDER — LISINOPRIL-HYDROCHLOROTHIAZIDE 20-25 MG PO TABS: 1.0000 | ORAL_TABLET | Freq: Every day | ORAL | 1 refills | Status: DC

## 2020-05-23 NOTE — Progress Notes (Signed)
Virtual Visit Note  I connected with patient on 05/23/20 at 500pm by video epic and verified that I am speaking with the correct person using two identifiers. Julie Lucas is currently located at home and patient is currently with them during visit. The provider, Myles Lipps, MD is located in their office at time of visit.  I discussed the limitations, risks, security and privacy concerns of performing an evaluation and management service by telephone and the availability of in person appointments. I also discussed with the patient that there may be a patient responsible charge related to this service. The patient expressed understanding and agreed to proceed.   I provided 9  minutes of non-face-to-face time during this encounter.  Chief Complaint  Patient presents with  . Hypertension    requesting 6 month refill    HPI ? Last OV may 2021 Patient overall doing well No acute concerns today Taking coreg, lisinopril-hctz wo issues BP at home: 113/80. 127/83, 114/79. 119/81. 121/88 Working on LFM, mostly cutting back on sugars Goes to obgyn in [place] Came in earlier today to have LFTs drawn Normal eval for abnormal LFTS by GI in 2019 with normalization of values  Lab Results  Component Value Date   ALT 224 (H) 01/08/2020   AST 119 (H) 01/08/2020   ALKPHOS 67 01/08/2020   BILITOT 0.8 01/08/2020    Health Maintenance  Topic Date Due  . TETANUS/TDAP  07/16/2020 (Originally 03/24/2002)  . HIV Screening  01/08/2021 (Originally 03/24/1998)  . PAP SMEAR-Modifier  01/01/2023  . COVID-19 Vaccine  Completed  . Hepatitis C Screening  Completed  . INFLUENZA VACCINE  Discontinued    Allergies  Allergen Reactions  . Augmentin [Amoxicillin-Pot Clavulanate] Nausea And Vomiting  . Sulfa Antibiotics Hives    Prior to Admission medications   Medication Sig Start Date End Date Taking? Authorizing Provider  carvedilol (COREG) 12.5 MG tablet Take 1 tablet (12.5 mg  total) by mouth 2 (two) times daily with a meal. 01/09/20  Yes Myles Lipps, MD  levonorgestrel-ethinyl estradiol (SEASONALE,INTROVALE,JOLESSA) 0.15-0.03 MG tablet Take 1 tablet by mouth daily.   Yes [provider]  lisinopril-hydrochlorothiazide (ZESTORETIC) 20-25 MG tablet Take 1 tablet by mouth daily. 01/09/20  Yes Myles Lipps, MD    Past Medical History:  Diagnosis Date  . Allergic urticaria 03/05/2016  . Essential hypertension 11/19/2016  . Hypertension   . Inappropriate sinus tachycardia 11/19/2016    Past Surgical History:  Procedure Laterality Date  . thumb surgery      Social History   Tobacco Use  . Smoking status: Never Smoker  . Smokeless tobacco: Never Used  Substance Use Topics  . Alcohol use: Yes    Comment: occasionally    Family History  Problem Relation Age of Onset  . Allergic rhinitis Mother   . Hypertension Mother   . Allergic rhinitis Father   . Hypertension Father   . Hypertension Brother   . Diabetes Maternal Grandfather   . Diabetes Paternal Grandfather   . Angioedema Neg Hx   . Asthma Neg Hx   . Eczema Neg Hx   . Immunodeficiency Neg Hx   . Urticaria Neg Hx     Review of Systems  Constitutional: Negative for chills, fever and malaise/fatigue.  Respiratory: Negative for cough and shortness of breath.   Cardiovascular: Negative for chest pain, palpitations and leg swelling.  Gastrointestinal: Negative for abdominal pain, nausea and vomiting.  Neurological: Negative for dizziness and headaches.  Objective  Vitals as reported by the patient: per above  GEN: AAOx3, NAD HEENT: [place]/AT, pupils are symmetrical, EOMI, non-icteric sclera Resp: breathing comfortably, speaking in full sentences Skin: no rashes noted, no pallor Psych: good eye contact, normal mood and affect   ASSESSMENT and PLAN  1. Essential hypertension Controlled. Continue current regime.  - carvedilol (COREG) 12.5 MG tablet; Take 1 tablet (12.5 mg  total) by mouth 2 (two) times daily with a meal. - lisinopril-hydrochlorothiazide (ZESTORETIC) 20-25 MG tablet; Take 1 tablet by mouth daily.  2. Abnormal liver function tests Repeat labs today, previous workup negative, if remains abnormal refer back to GI, consider OCPs as factor  FOLLOW-UP: 6 months   The above assessment and management plan was discussed with the patient. The patient verbalized understanding of and has agreed to the management plan. Patient is aware to call the clinic if symptoms persist or worsen. Patient is aware when to return to the clinic for a follow-up visit. Patient educated on when it is appropriate to go to the emergency department.     Myles Lipps, MD Primary Care at [facility] [address] [place], [place] [address] Ph.  [phone] Fax [phone]

## 2020-05-23 NOTE — Patient Instructions (Signed)
° ° ° °  If you have lab work done today you will be contacted with your lab results within the next 2 weeks.  If you have not heard from us then please contact us. The fastest way to get your results is to register for My Chart. ° ° °IF you received an x-ray today, you will receive an invoice from [facility]. Please contact [facility] at [phone] with questions or concerns regarding your invoice.  ° °IF you received labwork today, you will receive an invoice from [facility]. Please contact [facility] at [phone] with questions or concerns regarding your invoice.  ° °Our billing staff will not be able to assist you with questions regarding bills from these companies. ° °You will be contacted with the lab results as soon as they are available. The fastest way to get your results is to activate your My Chart account. Instructions are located on the last page of this paperwork. If you have not heard from us regarding the results in 2 weeks, please contact this office. °  ° ° ° °

## 2020-05-24 LAB — HEPATIC FUNCTION PANEL
ALT: 46 IU/L — ABNORMAL HIGH (ref 0–32)
AST: 20 IU/L (ref 0–40)
Albumin: 4 g/dL (ref 3.8–4.8)
Alkaline Phosphatase: 57 IU/L (ref 44–121)
Bilirubin Total: 0.6 mg/dL (ref 0.0–1.2)
Bilirubin, Direct: 0.21 mg/dL (ref 0.00–0.40)
Total Protein: 6.9 g/dL (ref 6.0–8.5)

## 2020-07-06 ENCOUNTER — Encounter: Payer: Self-pay | Admitting: Family Medicine

## 2020-09-24 ENCOUNTER — Telehealth: Payer: Self-pay | Admitting: Family Medicine

## 2020-09-24 NOTE — Telephone Encounter (Signed)
In person required this is a TOC with med refill

## 2020-09-24 NOTE — Telephone Encounter (Signed)
Patient called to schedule appt for med refill. Patient wants to know if in-person appt necessary or if virtual appt ok. Currently scheduled for in-person appt on 3/16. Please advise at [phone]

## 2020-11-05 ENCOUNTER — Other Ambulatory Visit: Payer: Self-pay

## 2020-11-05 ENCOUNTER — Encounter: Payer: Self-pay | Admitting: Family Medicine

## 2020-11-05 ENCOUNTER — Ambulatory Visit (INDEPENDENT_AMBULATORY_CARE_PROVIDER_SITE_OTHER): Payer: 59 | Admitting: Family Medicine

## 2020-11-05 VITALS — BP 141/92 | HR 91 | Temp 98.2°F | Ht 64.75 in | Wt 195.0 lb

## 2020-11-05 DIAGNOSIS — Z130 Encounter for screening for diseases of the blood and blood-forming organs and certain disorders involving the immune mechanism: Secondary | ICD-10-CM

## 2020-11-05 DIAGNOSIS — Z1321 Encounter for screening for nutritional disorder: Secondary | ICD-10-CM

## 2020-11-05 DIAGNOSIS — Z1329 Encounter for screening for other suspected endocrine disorder: Secondary | ICD-10-CM | POA: Diagnosis not present

## 2020-11-05 DIAGNOSIS — Z13228 Encounter for screening for other metabolic disorders: Secondary | ICD-10-CM

## 2020-11-05 DIAGNOSIS — I10 Essential (primary) hypertension: Secondary | ICD-10-CM | POA: Diagnosis not present

## 2020-11-05 MED ORDER — LISINOPRIL-HYDROCHLOROTHIAZIDE 20-25 MG PO TABS: 1.0000 | ORAL_TABLET | Freq: Every day | ORAL | 3 refills | Status: DC

## 2020-11-05 MED ORDER — CARVEDILOL 12.5 MG PO TABS: 12.5000 mg | ORAL_TABLET | Freq: Two times a day (BID) | ORAL | 2 refills | Status: DC

## 2020-11-05 NOTE — Progress Notes (Signed)
3/15/202210:46 AM  Julie Lucas 01-05-1981, 40 y.o., female [id number]  Chief Complaint  Patient presents with  . Hypertension    Medication refill     HPI:   Patient is a 40 y.o. female with past medical history significant for HTN who presents today for medication follow up.  Denies acute issues at this time  Has been working from home for 2 years Will be transitioning to a hybrid schedule  HTN Carvedilol 12.5mg  Lisinopril/HCTZ 20/12.5 Goal< 130/80 Does check BP at home  Runs in the 120s Does get anxious in the clinic BP Readings from Last 3 Encounters:  11/05/20 (!) 141/92  06/08/18 110/86  03/30/18 (!) 139/96   Birth control: refilled by GYN  Health Maintenance  Topic Date Due  . Julie Lucas  Never done  . HIV Screening  01/08/2021 (Originally 03/24/1998)  . PAP SMEAR-Modifier  01/01/2023  . COVID-19 Vaccine  Completed  . Hepatitis C Screening  Completed  . HPV VACCINES  Aged Out  . INFLUENZA VACCINE  Discontinued     Depression screen [facility] 2/9 01/09/2020 07/17/2019 12/28/2018  Decreased Interest 0 0 0  Down, Depressed, Hopeless 0 0 0  PHQ - 2 Score 0 0 0    Fall Risk  01/09/2020 07/17/2019 12/28/2018 12/28/2018 03/30/2018  Falls in the past year? 0 0 0 0 No  Number falls in past yr: 0 0 0 0 -  Injury with Fall? 0 0 0 0 -     Allergies  Allergen Reactions  . Augmentin [Amoxicillin-Pot Clavulanate] Nausea And Vomiting  . Sulfa Antibiotics Hives    Prior to Admission medications   Medication Sig Start Date End Date Taking? Authorizing Provider  carvedilol (COREG) 12.5 MG tablet Take 1 tablet (12.5 mg total) by mouth 2 (two) times daily with a meal. 05/23/20   Lezlie Lye, Meda Coffee, MD  levonorgestrel-ethinyl estradiol (SEASONALE,INTROVALE,JOLESSA) 0.15-0.03 MG tablet Take 1 tablet by mouth daily.    [provider]  lisinopril-hydrochlorothiazide (ZESTORETIC) 20-25 MG tablet Take 1 tablet by mouth daily. 05/23/20   Noni Saupe, MD     Past Medical History:  Diagnosis Date  . Allergic urticaria 03/05/2016  . Essential hypertension 11/19/2016  . Hypertension   . Inappropriate sinus tachycardia 11/19/2016    Past Surgical History:  Procedure Laterality Date  . thumb surgery      Social History   Tobacco Use  . Smoking status: Never Smoker  . Smokeless tobacco: Never Used  Substance Use Topics  . Alcohol use: Yes    Comment: occasionally    Family History  Problem Relation Age of Onset  . Allergic rhinitis Mother   . Hypertension Mother   . Allergic rhinitis Father   . Hypertension Father   . Hypertension Brother   . Diabetes Maternal Grandfather   . Diabetes Paternal Grandfather   . Angioedema Neg Hx   . Asthma Neg Hx   . Eczema Neg Hx   . Immunodeficiency Neg Hx   . Urticaria Neg Hx     Review of Systems  Constitutional: Negative for chills, fever and malaise/fatigue.  Eyes: Negative for blurred vision and double vision.  Respiratory: Negative for cough, shortness of breath and wheezing.   Cardiovascular: Negative for chest pain, palpitations and leg swelling.  Gastrointestinal: Negative for abdominal pain, constipation, diarrhea, heartburn, nausea and vomiting.  Genitourinary: Negative for dysuria, frequency and hematuria.  Musculoskeletal: Negative for back pain and joint pain.  Skin: Negative for rash.  Neurological:  Negative for dizziness, weakness and headaches.     OBJECTIVE:  Today's Vitals   11/05/20 1037  BP: (!) 141/92  Pulse: 91  Temp: 98.2 F (36.8 C)  SpO2: 99%  Weight: 195 lb (88.5 kg)  Height: 5' 4.75" (1.645 m)   Body mass index is 32.7 kg/m.   Physical Exam Constitutional:      General: She is not in acute distress.    Appearance: Normal appearance. She is not ill-appearing.  HENT:     Head: Normocephalic.  Cardiovascular:     Rate and Rhythm: Normal rate and regular rhythm.     Pulses: Normal pulses.     Heart sounds: Normal heart sounds. No murmur  heard. No friction rub. No gallop.   Pulmonary:     Effort: Pulmonary effort is normal. No respiratory distress.     Breath sounds: Normal breath sounds. No stridor. No wheezing, rhonchi or rales.  Abdominal:     General: Bowel sounds are normal.     Palpations: Abdomen is soft.     Tenderness: There is no abdominal tenderness.  Musculoskeletal:     Right lower leg: No edema.     Left lower leg: No edema.  Skin:    General: Skin is warm and dry.  Neurological:     Mental Status: She is alert and oriented to person, place, and time.  Psychiatric:        Mood and Affect: Mood normal.        Behavior: Behavior normal.     No results found for this or any previous visit (from the past 24 hour(s)).  No results found.   ASSESSMENT and PLAN  Problem List Items Addressed This Visit      Cardiovascular and Mediastinum   Essential hypertension   Relevant Medications   lisinopril-hydrochlorothiazide (ZESTORETIC) 20-25 MG tablet   carvedilol (COREG) 12.5 MG tablet   Other Relevant Orders   Comprehensive metabolic panel    Other Visit Diagnoses    Screening for endocrine, nutritional, metabolic and immunity disorder    -  Primary   Relevant Orders   CBC with Differential   Lipid Panel   Hemoglobin A1c   Vitamin D, 25-hydroxy   TSH      Plan . Medication refills sent . Will follow up with lab results   Return in about 6 months (around 05/08/2021).    Julie Carls Elijan Googe, FNP-BC Primary Care at [facility] [address] [place], [place] [address] Ph.  [phone] Fax [phone]

## 2020-11-05 NOTE — Patient Instructions (Addendum)
  Health Maintenance, Female Adopting a healthy lifestyle and getting preventive care are important in promoting health and wellness. Ask your health care provider about:  The right schedule for you to have regular tests and exams.  Things you can do on your own to prevent diseases and keep yourself healthy. What should I know about diet, weight, and exercise? Eat a healthy diet  Eat a diet that includes plenty of vegetables, fruits, low-fat dairy products, and lean protein.  Do not eat a lot of foods that are high in solid fats, added sugars, or sodium.   Maintain a healthy weight Body mass index (BMI) is used to identify weight problems. It estimates body fat based on height and weight. Your health care provider can help determine your BMI and help you achieve or maintain a healthy weight. Get regular exercise Get regular exercise. This is one of the most important things you can do for your health. Most adults should:  Exercise for at least 150 minutes each week. The exercise should increase your heart rate and make you sweat (moderate-intensity exercise).  Do strengthening exercises at least twice a week. This is in addition to the moderate-intensity exercise.  Spend less time sitting. Even light physical activity can be beneficial. Watch cholesterol and blood lipids Have your blood tested for lipids and cholesterol at 38 years of age, then have this test every 5 years. Have your cholesterol levels checked more often if:  Your lipid or cholesterol levels are high.  You are older than 38 years of age.  You are at high risk for heart disease. What should I know about cancer screening? Depending on your health history and family history, you may need to have cancer screening at various ages. This may include screening for:  Breast cancer.  Cervical cancer.  Colorectal cancer.  Skin cancer.  Lung cancer. What should I know about heart disease, diabetes, and high blood  pressure? Blood pressure and heart disease  High blood pressure causes heart disease and increases the risk of stroke. This is more likely to develop in people who have high blood pressure readings, are of African descent, or are overweight.  Have your blood pressure checked: ? Every 3-5 years if you are 18-39 years of age. ? Every year if you are 40 years old or older. Diabetes Have regular diabetes screenings. This checks your fasting blood sugar level. Have the screening done:  Once every three years after age 40 if you are at a normal weight and have a low risk for diabetes.  More often and at a younger age if you are overweight or have a high risk for diabetes. What should I know about preventing infection? Hepatitis B If you have a higher risk for hepatitis B, you should be screened for this virus. Talk with your health care provider to find out if you are at risk for hepatitis B infection. Hepatitis C Testing is recommended for:  Everyone born from 1945 through 1965.  Anyone with known risk factors for hepatitis C. Sexually transmitted infections (STIs)  Get screened for STIs, including gonorrhea and chlamydia, if: ? You are sexually active and are younger than 38 years of age. ? You are older than 38 years of age and your health care provider tells you that you are at risk for this type of infection. ? Your sexual activity has changed since you were last screened, and you are at increased risk for chlamydia or gonorrhea. Ask your health   care provider if you are at risk.  Ask your health care provider about whether you are at high risk for HIV. Your health care provider may recommend a prescription medicine to help prevent HIV infection. If you choose to take medicine to prevent HIV, you should first get tested for HIV. You should then be tested every 3 months for as long as you are taking the medicine. Pregnancy  If you are about to stop having your period (premenopausal) and  you may become pregnant, seek counseling before you get pregnant.  Take 400 to 800 micrograms (mcg) of folic acid every day if you become pregnant.  Ask for birth control (contraception) if you want to prevent pregnancy. Osteoporosis and menopause Osteoporosis is a disease in which the bones lose minerals and strength with aging. This can result in bone fractures. If you are 65 years old or older, or if you are at risk for osteoporosis and fractures, ask your health care provider if you should:  Be screened for bone loss.  Take a calcium or vitamin D supplement to lower your risk of fractures.  Be given hormone replacement therapy (HRT) to treat symptoms of menopause. Follow these instructions at home: Lifestyle  Do not use any products that contain nicotine or tobacco, such as cigarettes, e-cigarettes, and chewing tobacco. If you need help quitting, ask your health care provider.  Do not use street drugs.  Do not share needles.  Ask your health care provider for help if you need support or information about quitting drugs. Alcohol use  Do not drink alcohol if: ? Your health care provider tells you not to drink. ? You are pregnant, may be pregnant, or are planning to become pregnant.  If you drink alcohol: ? Limit how much you use to 0-1 drink a day. ? Limit intake if you are breastfeeding.  Be aware of how much alcohol is in your drink. In the [place] one drink equals one 12 oz bottle of beer (355 mL), one 5 oz glass of wine (148 mL), or one 1 oz glass of hard liquor (44 mL). General instructions  Schedule regular health, dental, and eye exams.  Stay current with your vaccines.  Tell your health care provider if: ? You often feel depressed. ? You have ever been abused or do not feel safe at home. Summary  Adopting a healthy lifestyle and getting preventive care are important in promoting health and wellness.  Follow your health care provider's instructions about healthy  diet, exercising, and getting tested or screened for diseases.  Follow your health care provider's instructions on monitoring your cholesterol and blood pressure. This information is not intended to replace advice given to you by your health care provider. Make sure you discuss any questions you have with your health care provider. Document Revised: 08/03/2018 Document Reviewed: 08/03/2018 Elsevier Patient Education  2021 Elsevier Inc.   If you have lab work done today you will be contacted with your lab results within the next 2 weeks.  If you have not heard from us then please contact us. The fastest way to get your results is to register for My Chart.   IF you received an x-ray today, you will receive an invoice from [facility]. Please contact [facility] at [phone] with questions or concerns regarding your invoice.   IF you received labwork today, you will receive an invoice from [facility]. Please contact [facility] at [phone] with questions or concerns regarding your invoice.   Our billing   staff will not be able to assist you with questions regarding bills from these companies.  You will be contacted with the lab results as soon as they are available. The fastest way to get your results is to activate your My Chart account. Instructions are located on the last page of this paperwork. If you have not heard from us regarding the results in 2 weeks, please contact this office.      

## 2020-11-06 ENCOUNTER — Ambulatory Visit: Payer: 59 | Admitting: Family Medicine

## 2021-08-14 ENCOUNTER — Encounter (HOSPITAL_BASED_OUTPATIENT_CLINIC_OR_DEPARTMENT_OTHER): Payer: Self-pay | Admitting: Nurse Practitioner

## 2021-08-14 ENCOUNTER — Ambulatory Visit (INDEPENDENT_AMBULATORY_CARE_PROVIDER_SITE_OTHER): Payer: 59 | Admitting: Nurse Practitioner

## 2021-08-14 ENCOUNTER — Other Ambulatory Visit: Payer: Self-pay

## 2021-08-14 VITALS — BP 128/82 | HR 93 | Ht 64.0 in | Wt 194.2 lb

## 2021-08-14 DIAGNOSIS — Z13228 Encounter for screening for other metabolic disorders: Secondary | ICD-10-CM

## 2021-08-14 DIAGNOSIS — R7989 Other specified abnormal findings of blood chemistry: Secondary | ICD-10-CM | POA: Diagnosis not present

## 2021-08-14 DIAGNOSIS — Z0000 Encounter for general adult medical examination without abnormal findings: Secondary | ICD-10-CM

## 2021-08-14 DIAGNOSIS — Z1329 Encounter for screening for other suspected endocrine disorder: Secondary | ICD-10-CM

## 2021-08-14 DIAGNOSIS — Z1321 Encounter for screening for nutritional disorder: Secondary | ICD-10-CM

## 2021-08-14 DIAGNOSIS — I10 Essential (primary) hypertension: Secondary | ICD-10-CM | POA: Diagnosis not present

## 2021-08-14 DIAGNOSIS — Z130 Encounter for screening for diseases of the blood and blood-forming organs and certain disorders involving the immune mechanism: Secondary | ICD-10-CM

## 2021-08-14 MED ORDER — LISINOPRIL-HYDROCHLOROTHIAZIDE 20-25 MG PO TABS: 1.0000 | ORAL_TABLET | Freq: Every day | ORAL | 1 refills | Status: DC

## 2021-08-14 MED ORDER — CARVEDILOL 12.5 MG PO TABS: 12.5000 mg | ORAL_TABLET | Freq: Two times a day (BID) | ORAL | 1 refills | Status: DC

## 2021-08-14 NOTE — Assessment & Plan Note (Signed)
BP well controlled today at 128/82.  Reviewed medication considerations and did find evidence of carvedilol causing elevation in LFT's in very rare cases. Unfortunately, this was not found until the patient had left.  I have contacted the patient and asked if she would like me to cancel the carvedilol and order equivalent dosing for an alternative BB. Bisoprolol would be an acceptable alternative at 5mg .  Currently awaiting response from the patient.  For now, we will monitor labs and determine if there are any concerning findings.  No alarm symptoms present.

## 2021-08-14 NOTE — Progress Notes (Signed)
Tollie Eth, DNP, AGNP-c Primary Care & Sports Medicine [address]   [address] [place], [place] [address] [phone]  New patient visit   Patient: Julie Lucas   DOB: [birth year]   39 y.o. Female  MRN: [id number] Visit Date: 08/14/2021  Patient Care Team: Kwamaine Cuppett, Sung Amabile, NP as PCP - General (Nurse Practitioner)  Today's healthcare provider: Tollie Eth, NP   Chief Complaint  Patient presents with   New Patient (Initial Visit)    Patient presents today to establish care. She would like to discuss a medication she is currently taking Coreg, she has heard mixed reviews. She feels that she needs a complete lab panel. She does need a RF on lisinopril and coreg   Subjective    HPI HPI     New Patient (Initial Visit)    Additional comments: Patient presents today to establish care. She would like to discuss a medication she is currently taking Coreg, she has heard mixed reviews. She feels that she needs a complete lab panel. She does need a RF on lisinopril and coreg      Last edited by Carnella Guadalajara on 08/14/2021  9:50 AM.      Wanita Chamberlain is a 40 y.o. female who presents today as a new patient to establish care.    She reports no significant concerns today. He has a history of HTN that is well controlled with lisinopril and carvedilol. She has some questions about the carvedilol possibly causing elevated liver enzymes as she did have a jump in this lab shortly after starting the medication. She tells me she completed an [place] which was normal and her labs have fluctuated between slightly elevated and normal range since that time. She denies any other known side effects, but does feel that she had slightly more hair loss shortly after starting  She has no CP, palpitations, HA, Dizziness, Vision changes, LE edema, abdominal pain, nausea, vomiting, or BM changes.   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   Past Surgical History:  Procedure Laterality Date   thumb surgery     Family Status  Relation Name Status   Mother  Alive   Father  Alive   Brother  (Not Specified)   MGF  (Not Specified)   PGF  (Not Specified)   Neg Hx  (Not Specified)   Family History  Problem Relation Age of Onset   Allergic rhinitis Mother    Hypertension Mother    Allergic rhinitis Father    Hypertension Father    Hypertension Brother    Diabetes Maternal Grandfather    Diabetes Paternal Grandfather    Angioedema Neg Hx    Asthma Neg Hx    Eczema Neg Hx    Immunodeficiency Neg Hx    Urticaria Neg Hx    Social History   Socioeconomic History   Marital status: Single    Spouse name: Not on file   Number of children: Not on file   Years of education: Not on file   Highest education level: Not on file  Occupational History   Not on file  Tobacco Use   Smoking status: Never   Smokeless tobacco: Never  Substance and Sexual Activity   Alcohol use: Yes    Comment: occasionally   Drug use: No   Sexual activity: Not on  file  Other Topics Concern   Not on file  Social History Narrative   Not on file   Social Determinants of Health   Financial Resource Strain: Not on file  Food Insecurity: Not on file  Transportation Needs: Not on file  Physical Activity: Not on file  Stress: Not on file  Social Connections: Not on file   Outpatient Medications Prior to Visit  Medication Sig   levonorgestrel-ethinyl estradiol (SEASONALE,INTROVALE,JOLESSA) 0.15-0.03 MG tablet Take 1 tablet by mouth daily.   [DISCONTINUED] carvedilol (COREG) 12.5 MG tablet Take 1 tablet (12.5 mg total) by mouth 2 (two) times daily with a meal.   [DISCONTINUED] lisinopril-hydrochlorothiazide (ZESTORETIC) 20-25 MG tablet Take 1 tablet by mouth daily.   No facility-administered medications prior to visit.   Allergies  Allergen  Reactions   Augmentin [Amoxicillin-Pot Clavulanate] Nausea And Vomiting   Sulfa Antibiotics Hives    Immunization History  Administered Date(s) Administered   PFIZER(Purple Top)SARS-COV-2 Vaccination 11/04/2019, 11/27/2019, 07/06/2020    Health Maintenance  Topic Date Due   HIV Screening  Never done   TETANUS/TDAP  Never done   COVID-19 Vaccine (4 - Booster for Pfizer series) 08/31/2020   PAP SMEAR-Modifier  01/01/2023   Hepatitis C Screening  Completed   Pneumococcal Vaccine 73-34 Years old  Aged Out   HPV VACCINES  Aged Out   INFLUENZA VACCINE  Discontinued    Patient Care Team: Raejean Swinford, Sung Amabile, NP as PCP - General (Nurse Practitioner)  Review of Systems All review of systems negative except what is listed in the HPI   Objective    BP 128/82    Pulse 93    Ht 5\' 4"  (1.626 m)    Wt 194 lb 3.2 oz (88.1 kg)    SpO2 98%    BMI 33.33 kg/m  Physical Exam Vitals and nursing note reviewed.  Constitutional:      General: She is not in acute distress.    Appearance: Normal appearance.  Eyes:     Extraocular Movements: Extraocular movements intact.     Conjunctiva/sclera: Conjunctivae normal.     Pupils: Pupils are equal, round, and reactive to light.  Neck:     Vascular: No carotid bruit.  Cardiovascular:     Rate and Rhythm: Normal rate and regular rhythm.     Pulses: Normal pulses.     Heart sounds: Normal heart sounds. No murmur heard. Pulmonary:     Effort: Pulmonary effort is normal.     Breath sounds: Normal breath sounds. No wheezing.  Abdominal:     General: Bowel sounds are normal.     Palpations: Abdomen is soft.  Musculoskeletal:        General: Normal range of motion.     Cervical back: Normal range of motion.     Right lower leg: No edema.     Left lower leg: No edema.  Skin:    General: Skin is warm and dry.     Capillary Refill: Capillary refill takes less than 2 seconds.  Neurological:     General: No focal deficit present.     Mental Status: She  is alert and oriented to person, place, and time.  Psychiatric:        Mood and Affect: Mood normal.        Behavior: Behavior normal.        Thought Content: Thought content normal.        Judgment: Judgment normal.    Depression Screen PHQ 2/9  Scores 08/14/2021 01/09/2020 07/17/2019 12/28/2018  PHQ - 2 Score 0 0 0 0   No results found for any visits on 08/14/21.  Assessment & Plan      Problem List Items Addressed This Visit     Essential hypertension - Primary    BP well controlled today at 128/82.  Reviewed medication considerations and did find evidence of carvedilol causing elevation in LFT's in very rare cases. Unfortunately, this was not found until the patient had left.  I have contacted the patient and asked if she would like me to cancel the carvedilol and order equivalent dosing for an alternative BB. Bisoprolol would be an acceptable alternative at 5mg .  Currently awaiting response from the patient.  For now, we will monitor labs and determine if there are any concerning findings.  No alarm symptoms present.       Relevant Medications   carvedilol (COREG) 12.5 MG tablet   lisinopril-hydrochlorothiazide (ZESTORETIC) 20-25 MG tablet   Other Relevant Orders   CBC with Differential/Platelet   Comprehensive metabolic panel   Lipid panel   Hemoglobin A1c   TSH   VITAMIN D 25 Hydroxy (Vit-D Deficiency, Fractures)   T4   T3   Abnormal liver function tests    Historically elevated LFT's of unknown cause Research on carvedilol reveals rare cases with elevation in LFT's that resolve when the medication is changed to another BB. Metoprolol is shown to do the same.  I have sent the patient a message offering to change the carvedilol due to the elevated enzymes. We are still waiting on lab results from today.  Will plan to change to bisoprolol at 5mg  if patient agrees with the change and continue to monitor her BP, HR, and liver function.       Relevant Orders   CBC with  Differential/Platelet   Comprehensive metabolic panel   Lipid panel   Hemoglobin A1c   TSH   VITAMIN D 25 Hydroxy (Vit-D Deficiency, Fractures)   T4   T3   Other Visit Diagnoses     Screening for endocrine, nutritional, metabolic and immunity disorder       Relevant Orders   CBC with Differential/Platelet   Comprehensive metabolic panel   Lipid panel   Hemoglobin A1c   TSH   VITAMIN D 25 Hydroxy (Vit-D Deficiency, Fractures)   T4   T3   Encounter for medical examination to establish care            Return in about 6 months (around 02/12/2022) for HTN.      Kiylee Thoreson, , NP, DNP, AGNP-C Primary Care & Sports Medicine at [facility]

## 2021-08-14 NOTE — Patient Instructions (Signed)
Thank you for choosing [place] at [facility] for your Primary Care needs. I am excited for the opportunity to partner with you to meet your health care goals. It was a pleasure meeting you today!  Recommendations from today's visit: We will plan to get labs today for complete evaluation.  Everything looks good on exam today.  I will send in your refills for lisinopril and carvedilol I was unable to find any information that either drug affects the liver function.   Information on diet, exercise, and health maintenance recommendations are listed below. This is information to help you be sure you are on track for optimal health and monitoring.   Please look over this and let us know if you have any questions or if you have completed any of the health maintenance outside of [place] so that we can be sure your records are up to date.  ___________________________________________________________ About Me: I am an Adult-Geriatric Nurse Practitioner with a background in caring for patients for more than 20 years with a strong intensive care background. I provide primary care and sports medicine services to patients age 81 and older within this office. My education had a strong focus on caring for the older adult population, which I am passionate about. I am also the director of the APP Fellowship with [facility].   My desire is to provide you with the best service through preventive medicine and supportive care. I consider you a part of the medical team and value your input. I work diligently to ensure that you are heard and your needs are met in a safe and effective manner. I want you to feel comfortable with me as your provider and want you to know that your health concerns are important to me.  For your information, our office hours are: Monday, Tuesday, and Thursday 8:00 AM - 5:00 PM Wednesday and Friday 8:00 AM - 12:00 PM.   In my time away from the office I am  teaching new APP's within the system and am unavailable, but my partner, Dr. Burnard Bunting is in the office for emergent needs.   If you have questions or concerns, please call our office at [phone] or send [place] a MyChart message and we will respond as quickly as possible.  ____________________________________________________________ MyChart:  For all urgent or time sensitive needs we ask that you please call the office to avoid delays. Our number is [phone]. MyChart is not constantly monitored and due to the large volume of messages a day, replies may take up to 72 business hours.  MyChart Policy: MyChart allows for you to see your visit notes, after visit summary, provider recommendations, lab and tests results, make an appointment, request refills, and contact your provider or the office for non-urgent questions or concerns. Providers are seeing patients during normal business hours and do not have built in time to review MyChart messages.  We ask that you allow a minimum of 3 business days for responses to [employer]. For this reason, please do not send urgent requests through [place]. Please call the office at [phone]. New and ongoing conditions may require a visit. We have virtual and in person visit available for your convenience.  Complex MyChart concerns may require a visit. Your provider may request you schedule a virtual or in person visit to ensure we are providing the best care possible. MyChart messages sent after 11:00 AM on Friday will not be received by the provider until Monday morning.  Lab and Test Results: You will receive your lab and test results on MyChart as soon as they are completed and results have been sent by the lab or testing facility. Due to this service, you will receive your results BEFORE your provider.  I review lab and tests results each morning prior to seeing patients. Some results require collaboration with other providers to ensure you are  receiving the most appropriate care. For this reason, we ask that you please allow a minimum of 3-5 business days from the time the ALL results have been received for your provider to receive and review lab and test results and contact you about these.  Most lab and test result comments from the provider will be sent through [place]. Your provider may recommend changes to the plan of care, follow-up visits, repeat testing, ask questions, or request an office visit to discuss these results. You may reply directly to this message or call the office at [phone] to provide information for the provider or set up an appointment. In some instances, you will be called with test results and recommendations. Please let us know if this is preferred and we will make note of this in your chart to provide this for you.    If you have not heard a response to your lab or test results in 5 business days from all results returning to [place], please call the office to let us know. We ask that you please avoid calling prior to this time unless there is an emergent concern. Due to high call volumes, this can delay the resulting process.  After Hours: For all non-emergency after hours needs, please call the office at [phone] and select the option to reach the on-call provider service. On-call services are shared between multiple [facility] offices and therefore it will not be possible to speak directly with your provider. On-call providers may provide medical advice and recommendations, but are unable to provide refills for maintenance medications.  For all emergency or urgent medical needs after normal business hours, we recommend that you seek care at the closest Urgent Care or Emergency Department to ensure appropriate treatment in a timely manner.  [facility] at [place] has a 24 hour emergency room located on the ground floor for your convenience.   Urgent Concerns During the Business Day Providers  are seeing patients from 8AM to [place] with a busy schedule and are most often not able to respond to non-urgent calls until the end of the day or the next business day. If you should have URGENT concerns during the day, please call and speak to the nurse or schedule a same day appointment so that we can address your concern without delay.   Thank you, again, for choosing me as your health care partner. I appreciate your trust and look forward to learning more about you.   Julie Keeler, DNP, AGNP-c ___________________________________________________________  Health Maintenance Recommendations Screening Testing Mammogram Every 1 -2 years based on history and risk factors Starting at age 43 Pap Smear Ages 21-39 every 3 years Ages 3-65 every 5 years with HPV testing More frequent testing may be required based on results and history Colon Cancer Screening Every 1-10 years based on test performed, risk factors, and history Starting at age 68 Bone Density Screening Every 2-10 years based on history Starting at age 36 for women Recommendations for men differ based on medication usage, history, and risk factors AAA Screening One time ultrasound Men 91-82 years old who have  every smoked Lung Cancer Screening Low Dose Lung CT every 12 months Age 41-80 years with a 30 pack-year smoking history who still smoke or who have quit within the last 15 years  Screening Labs Routine  Labs: Complete Blood Count (CBC), Complete Metabolic Panel (CMP), Cholesterol (Lipid Panel) Every 6-12 months based on history and medications May be recommended more frequently based on current conditions or previous results Hemoglobin A1c Lab Every 3-12 months based on history and previous results Starting at age 76 or earlier with diagnosis of diabetes, high cholesterol, BMI >26, and/or risk factors Frequent monitoring for patients with diabetes to ensure blood sugar control Thyroid Panel (TSH w/ T3 & T4) Every 6  months based on history, symptoms, and risk factors May be repeated more often if on medication HIV One time testing for all patients 64 and older May be repeated more frequently for patients with increased risk factors or exposure Hepatitis C One time testing for all patients 62 and older May be repeated more frequently for patients with increased risk factors or exposure Gonorrhea, Chlamydia Every 12 months for all sexually active persons 13-24 years Additional monitoring may be recommended for those who are considered high risk or who have symptoms PSA Men 75-55 years old with risk factors Additional screening may be recommended from age 25-69 based on risk factors, symptoms, and history  Vaccine Recommendations Tetanus Booster All adults every 10 years Flu Vaccine All patients 6 months and older every year COVID Vaccine All patients 12 years and older Initial dosing with booster May recommend additional booster based on age and health history HPV Vaccine 2 doses all patients age 7-26 Dosing may be considered for patients over 26 Shingles Vaccine (Shingrix) 2 doses all adults 4 years and older Pneumonia (Pneumovax 23) All adults 69 years and older May recommend earlier dosing based on health history Pneumonia (Prevnar 48) All adults 65 years and older Dosed 1 year after Pneumovax 23  Additional Screening, Testing, and Vaccinations may be recommended on an individualized basis based on family history, health history, risk factors, and/or exposure.  __________________________________________________________  Diet Recommendations for All Patients  I recommend that all patients maintain a diet low in saturated fats, carbohydrates, and cholesterol. While this can be challenging at first, it is not impossible and small changes can make big differences.  Things to try: Decreasing the amount of soda, sweet tea, and/or juice to one or less per day and replace with water While  water is always the first choice, if you do not like water you may consider adding a water additive without sugar to improve the taste other sugar free drinks Replace potatoes with a brightly colored vegetable at dinner Use healthy oils, such as canola oil or olive oil, instead of butter or hard margarine Limit your bread intake to two pieces or less a day Replace regular pasta with low carb pasta options Bake, broil, or grill foods instead of frying Monitor portion sizes  Eat smaller, more frequent meals throughout the day instead of large meals  An important thing to remember is, if you love foods that are not great for your health, you don't have to give them up completely. Instead, allow these foods to be a reward when you have done well. Allowing yourself to still have special treats every once in a while is a nice way to tell yourself thank you for working hard to keep yourself healthy.   Also remember that every day is a new day.  If you have a bad day and "fall off the wagon", you can still climb right back up and keep moving along on your journey!  We have resources available to help you!  Some websites that may be helpful include: www.http://carter.biz/  Www.VeryWellFit.com _____________________________________________________________  Activity Recommendations for All Patients  I recommend that all adults get at least 20 minutes of moderate physical activity that elevates your heart rate at least 5 days out of the week.  Some examples include: Walking or jogging at a pace that allows you to carry on a conversation Cycling (stationary bike or outdoors) Water aerobics Yoga Weight lifting Dancing If physical limitations prevent you from putting stress on your joints, exercise in a pool or seated in a chair are excellent options.  Do determine your MAXIMUM heart rate for activity: YOUR AGE - 220 = MAX HeartRate   Remember! Do not push yourself too hard.  Start slowly and build up  your pace, speed, weight, time in exercise, etc.  Allow your body to rest between exercise and get good sleep. You will need more water than normal when you are exerting yourself. Do not wait until you are thirsty to drink. Drink with a purpose of getting in at least 8, 8 ounce glasses of water a day plus more depending on how much you exercise and sweat.    If you begin to develop dizziness, chest pain, abdominal pain, jaw pain, shortness of breath, headache, vision changes, lightheadedness, or other concerning symptoms, stop the activity and allow your body to rest. If your symptoms are severe, seek emergency evaluation immediately. If your symptoms are concerning, but not severe, please let us know so that we can recommend further evaluation.

## 2021-08-14 NOTE — Assessment & Plan Note (Signed)
Historically elevated LFT's of unknown cause Research on carvedilol reveals rare cases with elevation in LFT's that resolve when the medication is changed to another BB. Metoprolol is shown to do the same.  I have sent the patient a message offering to change the carvedilol due to the elevated enzymes. We are still waiting on lab results from today.  Will plan to change to bisoprolol at 5mg  if patient agrees with the change and continue to monitor her BP, HR, and liver function.

## 2021-08-15 LAB — CBC WITH DIFFERENTIAL/PLATELET
Basophils Absolute: 0 10*3/uL (ref 0.0–0.2)
Basos: 1 %
EOS (ABSOLUTE): 0.1 10*3/uL (ref 0.0–0.4)
Eos: 2 %
Hematocrit: 39 % (ref 34.0–46.6)
Hemoglobin: 13.3 g/dL (ref 11.1–15.9)
Immature Grans (Abs): 0 10*3/uL (ref 0.0–0.1)
Immature Granulocytes: 0 %
Lymphocytes Absolute: 1.5 10*3/uL (ref 0.7–3.1)
Lymphs: 35 %
MCH: 29.1 pg (ref 26.6–33.0)
MCHC: 34.1 g/dL (ref 31.5–35.7)
MCV: 85 fL (ref 79–97)
Monocytes Absolute: 0.5 10*3/uL (ref 0.1–0.9)
Monocytes: 11 %
Neutrophils Absolute: 2.3 10*3/uL (ref 1.4–7.0)
Neutrophils: 51 %
Platelets: 375 10*3/uL (ref 150–450)
RBC: 4.57 x10E6/uL (ref 3.77–5.28)
RDW: 11.9 % (ref 11.7–15.4)
WBC: 4.4 10*3/uL (ref 3.4–10.8)

## 2021-08-15 LAB — LIPID PANEL
Chol/HDL Ratio: 4.2 ratio (ref 0.0–4.4)
Cholesterol, Total: 175 mg/dL (ref 100–199)
HDL: 42 mg/dL (ref >39)
LDL Chol Calc (NIH): 113 mg/dL — ABNORMAL HIGH (ref 0–99)
Triglycerides: 109 mg/dL (ref 0–149)
VLDL Cholesterol Cal: 20 mg/dL (ref 5–40)

## 2021-08-15 LAB — HEMOGLOBIN A1C
Est. average glucose Bld gHb Est-mCnc: 105 mg/dL
Hgb A1c MFr Bld: 5.3 % (ref 4.8–5.6)

## 2021-08-15 LAB — COMPREHENSIVE METABOLIC PANEL
ALT: 80 IU/L — ABNORMAL HIGH (ref 0–32)
AST: 51 IU/L — ABNORMAL HIGH (ref 0–40)
Albumin/Globulin Ratio: 1.4 (ref 1.2–2.2)
Albumin: 4 g/dL (ref 3.8–4.8)
Alkaline Phosphatase: 53 IU/L (ref 44–121)
BUN/Creatinine Ratio: 8 — ABNORMAL LOW (ref 9–23)
BUN: 7 mg/dL (ref 6–20)
Bilirubin Total: 0.9 mg/dL (ref 0.0–1.2)
CO2: 22 mmol/L (ref 20–29)
Calcium: 9.1 mg/dL (ref 8.7–10.2)
Chloride: 102 mmol/L (ref 96–106)
Creatinine, Ser: 0.84 mg/dL (ref 0.57–1.00)
Globulin, Total: 2.9 g/dL (ref 1.5–4.5)
Glucose: 79 mg/dL (ref 70–99)
Potassium: 4.4 mmol/L (ref 3.5–5.2)
Sodium: 138 mmol/L (ref 134–144)
Total Protein: 6.9 g/dL (ref 6.0–8.5)
eGFR: 91 mL/min/{1.73_m2} (ref >59)

## 2021-08-15 LAB — T4: T4, Total: 9.1 ug/dL (ref 4.5–12.0)

## 2021-08-15 LAB — TSH: TSH: 0.885 u[IU]/mL (ref 0.450–4.500)

## 2021-08-15 LAB — VITAMIN D 25 HYDROXY (VIT D DEFICIENCY, FRACTURES): Vit D, 25-Hydroxy: 13.8 ng/mL — ABNORMAL LOW (ref 30.0–100.0)

## 2021-08-15 LAB — T3: T3, Total: 172 ng/dL (ref 71–180)

## 2021-08-19 ENCOUNTER — Other Ambulatory Visit (HOSPITAL_BASED_OUTPATIENT_CLINIC_OR_DEPARTMENT_OTHER): Payer: Self-pay | Admitting: Nurse Practitioner

## 2021-08-19 DIAGNOSIS — R7989 Other specified abnormal findings of blood chemistry: Secondary | ICD-10-CM

## 2021-08-19 DIAGNOSIS — I10 Essential (primary) hypertension: Secondary | ICD-10-CM

## 2021-08-19 DIAGNOSIS — E559 Vitamin D deficiency, unspecified: Secondary | ICD-10-CM

## 2021-08-19 MED ORDER — NEBIVOLOL HCL 2.5 MG PO TABS: 2.5000 mg | ORAL_TABLET | Freq: Every day | ORAL | 3 refills | Status: DC

## 2021-08-19 MED ORDER — VITAMIN D (ERGOCALCIFEROL) 1.25 MG (50000 UNIT) PO CAPS: 50000.0000 [IU] | ORAL_CAPSULE | ORAL | 0 refills | Status: DC

## 2021-10-30 ENCOUNTER — Encounter (HOSPITAL_BASED_OUTPATIENT_CLINIC_OR_DEPARTMENT_OTHER): Payer: Self-pay | Admitting: Nurse Practitioner

## 2021-10-30 DIAGNOSIS — E559 Vitamin D deficiency, unspecified: Secondary | ICD-10-CM

## 2021-11-02 MED ORDER — VITAMIN D (ERGOCALCIFEROL) 1.25 MG (50000 UNIT) PO CAPS: 50000.0000 [IU] | ORAL_CAPSULE | ORAL | 0 refills | Status: DC

## 2021-11-19 ENCOUNTER — Ambulatory Visit: Payer: 59 | Admitting: Orthopaedic Surgery

## 2021-12-03 ENCOUNTER — Other Ambulatory Visit (HOSPITAL_BASED_OUTPATIENT_CLINIC_OR_DEPARTMENT_OTHER): Payer: Self-pay

## 2021-12-03 DIAGNOSIS — R7989 Other specified abnormal findings of blood chemistry: Secondary | ICD-10-CM

## 2021-12-03 DIAGNOSIS — I10 Essential (primary) hypertension: Secondary | ICD-10-CM

## 2021-12-03 MED ORDER — NEBIVOLOL HCL 2.5 MG PO TABS: 2.5000 mg | ORAL_TABLET | Freq: Every day | ORAL | 3 refills | Status: DC

## 2022-01-09 LAB — RESULTS CONSOLE HPV: CHL HPV: NEGATIVE

## 2022-01-09 LAB — HM PAP SMEAR: HM Pap smear: NEGATIVE

## 2022-02-12 ENCOUNTER — Ambulatory Visit (HOSPITAL_BASED_OUTPATIENT_CLINIC_OR_DEPARTMENT_OTHER): Payer: 59 | Admitting: Nurse Practitioner

## 2022-02-18 ENCOUNTER — Ambulatory Visit (INDEPENDENT_AMBULATORY_CARE_PROVIDER_SITE_OTHER): Payer: 59 | Admitting: Nurse Practitioner

## 2022-02-18 ENCOUNTER — Encounter (HOSPITAL_BASED_OUTPATIENT_CLINIC_OR_DEPARTMENT_OTHER): Payer: Self-pay | Admitting: Nurse Practitioner

## 2022-02-18 DIAGNOSIS — I10 Essential (primary) hypertension: Secondary | ICD-10-CM

## 2022-02-18 DIAGNOSIS — R7989 Other specified abnormal findings of blood chemistry: Secondary | ICD-10-CM | POA: Diagnosis not present

## 2022-02-18 DIAGNOSIS — E559 Vitamin D deficiency, unspecified: Secondary | ICD-10-CM

## 2022-02-18 MED ORDER — VITAMIN D (ERGOCALCIFEROL) 1.25 MG (50000 UNIT) PO CAPS: 50000.0000 [IU] | ORAL_CAPSULE | ORAL | 0 refills | Status: DC

## 2022-02-18 MED ORDER — LISINOPRIL-HYDROCHLOROTHIAZIDE 20-25 MG PO TABS: 1.0000 | ORAL_TABLET | Freq: Every day | ORAL | 3 refills | Status: DC

## 2022-02-18 MED ORDER — NEBIVOLOL HCL 2.5 MG PO TABS: 2.5000 mg | ORAL_TABLET | Freq: Every day | ORAL | 3 refills | Status: DC

## 2022-02-18 NOTE — Patient Instructions (Signed)
It was a pleasure seeing you today. I hope your time spent with [place] was pleasant and helpful. Please let us know if there is anything we can do to improve the service you receive.   Today we discussed concerns with:  Vitamin D deficiency  Essential hypertension   The following orders have been placed for you today:  Orders Placed This Encounter  Procedures   Comprehensive metabolic panel    Order Specific Question:   Has the patient fasted?    Answer:   Yes    Order Specific Question:   Release to patient    Answer:   Immediate   CBC with Differential/Platelet    Order Specific Question:   Release to patient    Answer:   Immediate   VITAMIN D 25 Hydroxy (Vit-D Deficiency, Fractures)    Order Specific Question:   Release to patient    Answer:   Immediate     Important Office Information Lab Results If labs were ordered, please note that you will see results through MyChart as soon as they come available from [facility].  It takes up to 5 business days for the results to be routed to me and for me to review them once all of the lab results have come through from [facility]. I will make recommendations based on your results and send these through MyChart or someone from the office will call you to discuss. If your labs are abnormal, we may contact you to schedule a visit to discuss the results and make recommendations.  If you have not heard from [place] within 5 business days or you have waited longer than a week and your lab results have not come through on MyChart, please feel free to call the office or send a message through MyChart to follow-up on these labs.   Referrals If referrals were placed today, the office where the referral was sent will contact you either by phone or through MyChart to set up scheduling. Please note that it can take up to a week for the referral office to contact you. If you do not hear from them in a week, please contact the referral office directly to inquire about  scheduling.   Condition Treated If your condition worsens or you begin to have new symptoms, please schedule a follow-up appointment for further evaluation. If you are not sure if an appointment is needed, you may call the office to leave a message for the nurse and someone will contact you with recommendations.  If you have an urgent or life threatening emergency, please do not call the office, but seek emergency evaluation by calling 911 or going to the nearest emergency room for evaluation.   MyChart and Phone Calls Please do not use MyChart for urgent messages. It may take up to 3 business days for MyChart messages to be read by staff and if they are unable to handle the request, an additional 3 business days for them to be routed to me and for my response.  Messages sent to the provider through MyChart do not come directly to the provider, please allow time for these messages to be routed and for me to respond.  We get a large volume of MyChart messages daily and these are responded to in the order received.   For urgent messages, please call the office at [phone] and speak with the front office staff or leave a message on the line of my assistant for guidance.  We are seeing  patients from the hours of 8:00 am through 5:00 pm and calls directly to the nurse may not be answered immediately due to seeing patients, but your call will be returned as soon as possible.  Phone  messages received after 4:00 PM Monday through Thursday may not be returned until the following business day. Phone messages received after 11:00 AM on Friday may not be returned until Monday.   After Hours We share on call hours with providers from other offices. If you have an urgent need after hours that cannot wait until the next business day, please contact the on call provider by calling the office number. A nurse will speak with you and contact the provider if needed for recommendations.  If you have an urgent or  life threatening emergency after hours, please do not call the on call provider, but seek emergency evaluation by calling 911 or going to the nearest emergency room for evaluation.   Paperwork All paperwork requires a minimum of 5 days to complete and return to you or the designated personnel. Please keep this in mind when bringing in forms or sending requests for paperwork completion to the office.

## 2022-02-18 NOTE — Progress Notes (Signed)
Shawna Clamp, DNP, AGNP-c [facility] [address] [place], [place] [address] [phone] Office [phone] Fax  ESTABLISHED PATIENT- Chronic Health and/or Follow-Up Visit  Blood pressure 122/82, pulse 99, height 5\' 4"  (1.626 m), weight 193 lb 1.6 oz (87.6 kg), SpO2 99 %.  Follow-up (Patients presents today for follow up HTN. She is feeling much better. In the future she would like schedule a PAP. )   HPI  Julie Lucas  is a 41 y.o. year old female presenting today for evaluation and management of the following: HYPERTENSION Hypertension status: stable Satisfied with current treatment? yes Duration of hypertension: months BP monitoring frequency:  a few times a week BP range: 120's/80's primarily BP medication side effects:  no Medication compliance: excellent compliance Aspirin: no Recurrent headaches: no Visual changes: no Palpitations: no Dyspnea: no Chest pain: no Lower extremity edema: no Dizzy/lightheaded: no  Has been taking tylenol regularly for pain.   ROS All ROS negative with exception of what is listed in HPI  PHYSICAL EXAM Physical Exam Vitals and nursing note reviewed.  Constitutional:      General: She is not in acute distress.    Appearance: Normal appearance.  HENT:     Head: Normocephalic.  Eyes:     Extraocular Movements: Extraocular movements intact.     Conjunctiva/sclera: Conjunctivae normal.     Pupils: Pupils are equal, round, and reactive to light.  Neck:     Vascular: No carotid bruit.  Cardiovascular:     Rate and Rhythm: Normal rate and regular rhythm.     Pulses: Normal pulses.     Heart sounds: Normal heart sounds. No murmur heard. Pulmonary:     Effort: Pulmonary effort is normal.     Breath sounds: Normal breath sounds. No wheezing.  Abdominal:     General: Bowel sounds are normal. There is no distension.     Palpations: Abdomen is soft.     Tenderness: There  is no abdominal tenderness. There is no guarding.  Musculoskeletal:        General: Normal range of motion.     Cervical back: Normal range of motion and neck supple.     Right lower leg: No edema.     Left lower leg: No edema.  Lymphadenopathy:     Cervical: No cervical adenopathy.  Skin:    General: Skin is warm and dry.     Capillary Refill: Capillary refill takes less than 2 seconds.  Neurological:     General: No focal deficit present.     Mental Status: She is alert and oriented to person, place, and time.  Psychiatric:        Mood and Affect: Mood normal.        Behavior: Behavior normal.        Thought Content: Thought content normal.        Judgment: Judgment normal.     ASSESSMENT & PLAN Problem List Items Addressed This Visit     Essential hypertension    Chronic. Well controlled. No alarm symptoms present. Taking medication as prescribed.  Will continue current treatment and recommend at home monitoring to ensure control maintains. If new or worsening blood pressures are noted, patient will notify.  Labs today. F/U 6 months       Relevant Medications   nebivolol (BYSTOLIC) 2.5 MG tablet   lisinopril-hydrochlorothiazide (ZESTORETIC) 20-25 MG tablet   Other Relevant Orders   Comprehensive metabolic panel (Completed)  CBC with Differential/Platelet (Completed)   Abnormal liver function tests    Repeat labs today for monitoring. No alarms sx at this time. Recommend monitoring tylenol intake and avoiding more than recommended to prevent liver damage.       Relevant Medications   nebivolol (BYSTOLIC) 2.5 MG tablet   Other Relevant Orders   Comprehensive metabolic panel (Completed)   Other Visit Diagnoses     Vitamin D deficiency       Relevant Medications   Vitamin D, Ergocalciferol, (DRISDOL) 1.25 MG (50000 UNIT) CAPS capsule   Other Relevant Orders   VITAMIN D 25 Hydroxy (Vit-D Deficiency, Fractures) (Completed)        FOLLOW-UP Return in about 6  months (around 08/20/2022) for HTN.  Shawna Clamp, DNP, AGNP-c 02/18/2022  8:48 AM

## 2022-02-19 LAB — CBC WITH DIFFERENTIAL/PLATELET
Basophils Absolute: 0 10*3/uL (ref 0.0–0.2)
Basos: 1 %
EOS (ABSOLUTE): 0.1 10*3/uL (ref 0.0–0.4)
Eos: 3 %
Hematocrit: 39 % (ref 34.0–46.6)
Hemoglobin: 13 g/dL (ref 11.1–15.9)
Immature Grans (Abs): 0 10*3/uL (ref 0.0–0.1)
Immature Granulocytes: 0 %
Lymphocytes Absolute: 1.6 10*3/uL (ref 0.7–3.1)
Lymphs: 35 %
MCH: 29.2 pg (ref 26.6–33.0)
MCHC: 33.3 g/dL (ref 31.5–35.7)
MCV: 88 fL (ref 79–97)
Monocytes Absolute: 0.5 10*3/uL (ref 0.1–0.9)
Monocytes: 10 %
Neutrophils Absolute: 2.4 10*3/uL (ref 1.4–7.0)
Neutrophils: 51 %
Platelets: 347 10*3/uL (ref 150–450)
RBC: 4.45 x10E6/uL (ref 3.77–5.28)
RDW: 12.4 % (ref 11.7–15.4)
WBC: 4.6 10*3/uL (ref 3.4–10.8)

## 2022-02-19 LAB — COMPREHENSIVE METABOLIC PANEL
ALT: 31 IU/L (ref 0–32)
AST: 28 IU/L (ref 0–40)
Albumin/Globulin Ratio: 1.3 (ref 1.2–2.2)
Albumin: 4 g/dL (ref 3.8–4.8)
Alkaline Phosphatase: 49 IU/L (ref 44–121)
BUN/Creatinine Ratio: 9 (ref 9–23)
BUN: 8 mg/dL (ref 6–20)
Bilirubin Total: 0.8 mg/dL (ref 0.0–1.2)
CO2: 23 mmol/L (ref 20–29)
Calcium: 9.4 mg/dL (ref 8.7–10.2)
Chloride: 101 mmol/L (ref 96–106)
Creatinine, Ser: 0.85 mg/dL (ref 0.57–1.00)
Globulin, Total: 3.1 g/dL (ref 1.5–4.5)
Glucose: 83 mg/dL (ref 70–99)
Potassium: 3.7 mmol/L (ref 3.5–5.2)
Sodium: 138 mmol/L (ref 134–144)
Total Protein: 7.1 g/dL (ref 6.0–8.5)
eGFR: 90 mL/min/{1.73_m2} (ref >59)

## 2022-02-19 LAB — VITAMIN D 25 HYDROXY (VIT D DEFICIENCY, FRACTURES): Vit D, 25-Hydroxy: 60.5 ng/mL (ref 30.0–100.0)

## 2022-03-11 NOTE — Assessment & Plan Note (Signed)
Repeat labs today for monitoring. No alarms sx at this time. Recommend monitoring tylenol intake and avoiding more than recommended to prevent liver damage.

## 2022-03-11 NOTE — Assessment & Plan Note (Signed)
Chronic. Well controlled. No alarm symptoms present. Taking medication as prescribed.  Will continue current treatment and recommend at home monitoring to ensure control maintains. If new or worsening blood pressures are noted, patient will notify.  Labs today. F/U 6 months

## 2022-03-22 ENCOUNTER — Encounter (HOSPITAL_BASED_OUTPATIENT_CLINIC_OR_DEPARTMENT_OTHER): Payer: Self-pay | Admitting: Nurse Practitioner

## 2022-03-30 ENCOUNTER — Encounter (HOSPITAL_BASED_OUTPATIENT_CLINIC_OR_DEPARTMENT_OTHER): Payer: Self-pay | Admitting: Nurse Practitioner

## 2022-03-30 ENCOUNTER — Ambulatory Visit (INDEPENDENT_AMBULATORY_CARE_PROVIDER_SITE_OTHER): Payer: 59 | Admitting: Nurse Practitioner

## 2022-03-30 VITALS — BP 132/74 | HR 98 | Temp 98.7°F | Ht 64.0 in | Wt 196.0 lb

## 2022-03-30 DIAGNOSIS — I10 Essential (primary) hypertension: Secondary | ICD-10-CM | POA: Diagnosis not present

## 2022-03-30 MED ORDER — VALSARTAN-HYDROCHLOROTHIAZIDE 80-12.5 MG PO TABS: 1.0000 | ORAL_TABLET | Freq: Every day | ORAL | 3 refills | Status: DC

## 2022-03-30 NOTE — Progress Notes (Signed)
Tollie Eth, DNP, AGNP-c [facility] [address]  [address] [place], [place] [address] [phone]  Subjective:   Julie Lucas is a 41 y.o. female presents to day for recent allergic reaction to lisinopril.  She reports that approximately 1 week ago she woke up and noted tingling in her lips and mouth.  Within the next few hours she noticed that her lips were beginning to swell and the tingling was getting worse.  She did present to the urgent care for evaluation and it was determined that she was likely having an allergic reaction to her lisinopril.  At that time she was taken off of the lisinopril and started on HCTZ only.  She tells me since that time her symptoms have completely resolved and she denies any concerns with shortness of breath, swelling of the lips, tongue, or mucous membranes.  She has noticed that her blood pressure has not been as controlled as it has in the past with the lisinopril and she would like to discuss other medications that could be helpful to better manage her blood pressure.   PMH, Medications, and Allergies reviewed and updated in chart.   ROS negative except for what is listed in HPI. Objective:  BP 132/74   Pulse 98   Temp 98.7 F (37.1 C)   Ht 5\' 4"  (1.626 m)   Wt 196 lb (88.9 kg)   SpO2 99%   BMI 33.64 kg/m  Physical Exam Vitals and nursing note reviewed.  Constitutional:      General: She is not in acute distress.    Appearance: Normal appearance.  HENT:     Head: Normocephalic.  Eyes:     Extraocular Movements: Extraocular movements intact.     Conjunctiva/sclera: Conjunctivae normal.     Pupils: Pupils are equal, round, and reactive to light.  Neck:     Vascular: No carotid bruit.  Cardiovascular:     Rate and Rhythm: Normal rate and regular rhythm.     Pulses: Normal pulses.     Heart sounds: Normal heart sounds. No murmur heard. Pulmonary:     Effort: Pulmonary effort is  normal.     Breath sounds: Normal breath sounds. No wheezing.  Abdominal:     General: Bowel sounds are normal. There is no distension.     Palpations: Abdomen is soft.     Tenderness: There is no abdominal tenderness. There is no guarding.  Musculoskeletal:        General: Normal range of motion.     Cervical back: Normal range of motion and neck supple.     Right lower leg: No edema.     Left lower leg: No edema.  Lymphadenopathy:     Cervical: No cervical adenopathy.  Skin:    General: Skin is warm and dry.     Capillary Refill: Capillary refill takes less than 2 seconds.  Neurological:     General: No focal deficit present.     Mental Status: She is alert and oriented to person, place, and time.  Psychiatric:        Mood and Affect: Mood normal.        Behavior: Behavior normal.        Thought Content: Thought content normal.        Judgment: Judgment normal.           Assessment & Plan:   Problem List Items Addressed This Visit  Essential hypertension - Primary    Recent allergic reaction to lisinopril.  Patient has stopped taking this medication and all symptoms have resolved.  Blood pressures are not completely controlled at this time on HCTZ only.  Is discussed options with patient and recommend starting valsartan-HCTZ 80-12.5 mg dose to see if we can get improved control of her blood pressure levels.  Encouraged the patient to monitor her blood pressure daily over the next 2 weeks and if blood pressures remain greater than 130/85 to notify the office and we will plan to increase the dose to 160-25 mg of valsartan-HCTZ for improved control.  No alarm symptoms are present at this time.  Will continue to monitor closely.      Relevant Medications   valsartan-hydrochlorothiazide (DIOVAN-HCT) 80-12.5 MG tablet     Tollie Eth, DNP, AGNP-c 03/31/2022  8:11 AM

## 2022-03-30 NOTE — Patient Instructions (Signed)
I have sent in a new medication for you-  Valsartan 80mg  with HCTZ 12.5mg  combo pill  Check your blood pressure in the morning for the next 2 weeks if your blood pressure is consistently over 130/85 then send me a message and let me know and we can plan to double the medication dose as long as you are tolerating it ok.

## 2022-03-30 NOTE — Telephone Encounter (Signed)
Patient called the office and made appointment for today 03/30/2022

## 2022-03-31 NOTE — Assessment & Plan Note (Signed)
Recent allergic reaction to lisinopril.  Patient has stopped taking this medication and all symptoms have resolved.  Blood pressures are not completely controlled at this time on HCTZ only.  Is discussed options with patient and recommend starting valsartan-HCTZ 80-12.5 mg dose to see if we can get improved control of her blood pressure levels.  Encouraged the patient to monitor her blood pressure daily over the next 2 weeks and if blood pressures remain greater than 130/85 to notify the office and we will plan to increase the dose to 160-25 mg of valsartan-HCTZ for improved control.  No alarm symptoms are present at this time.  Will continue to monitor closely.

## 2022-04-22 ENCOUNTER — Other Ambulatory Visit (HOSPITAL_BASED_OUTPATIENT_CLINIC_OR_DEPARTMENT_OTHER): Payer: Self-pay | Admitting: Nurse Practitioner

## 2022-04-22 DIAGNOSIS — E559 Vitamin D deficiency, unspecified: Secondary | ICD-10-CM

## 2022-06-16 ENCOUNTER — Encounter (HOSPITAL_BASED_OUTPATIENT_CLINIC_OR_DEPARTMENT_OTHER): Payer: Self-pay | Admitting: Nurse Practitioner

## 2022-06-16 DIAGNOSIS — R058 Other specified cough: Secondary | ICD-10-CM

## 2022-06-18 NOTE — Telephone Encounter (Signed)
Patient is calling again regarding cough, Please advise

## 2022-06-22 MED ORDER — AZITHROMYCIN 250 MG PO TABS: ORAL_TABLET | ORAL | 0 refills | Status: AC

## 2022-06-22 MED ORDER — ALBUTEROL SULFATE HFA 108 (90 BASE) MCG/ACT IN AERS: 2.0000 | INHALATION_SPRAY | Freq: Four times a day (QID) | RESPIRATORY_TRACT | 11 refills | Status: DC | PRN

## 2022-06-23 ENCOUNTER — Telehealth (HOSPITAL_BASED_OUTPATIENT_CLINIC_OR_DEPARTMENT_OTHER): Payer: 59 | Admitting: Nurse Practitioner

## 2022-06-23 ENCOUNTER — Encounter (HOSPITAL_BASED_OUTPATIENT_CLINIC_OR_DEPARTMENT_OTHER): Payer: Self-pay

## 2022-07-08 ENCOUNTER — Encounter (HOSPITAL_BASED_OUTPATIENT_CLINIC_OR_DEPARTMENT_OTHER): Payer: Self-pay | Admitting: Nurse Practitioner

## 2022-08-12 ENCOUNTER — Ambulatory Visit (HOSPITAL_BASED_OUTPATIENT_CLINIC_OR_DEPARTMENT_OTHER): Payer: 59 | Admitting: Nurse Practitioner

## 2022-08-13 ENCOUNTER — Encounter: Payer: Self-pay | Admitting: Nurse Practitioner

## 2022-08-13 ENCOUNTER — Ambulatory Visit (INDEPENDENT_AMBULATORY_CARE_PROVIDER_SITE_OTHER): Payer: 59 | Admitting: Nurse Practitioner

## 2022-08-13 VITALS — BP 122/80 | HR 76 | Ht 64.0 in | Wt 203.8 lb

## 2022-08-13 DIAGNOSIS — I10 Essential (primary) hypertension: Secondary | ICD-10-CM | POA: Diagnosis not present

## 2022-08-13 DIAGNOSIS — Z3041 Encounter for surveillance of contraceptive pills: Secondary | ICD-10-CM | POA: Diagnosis not present

## 2022-08-13 DIAGNOSIS — I4711 Inappropriate sinus tachycardia, so stated: Secondary | ICD-10-CM

## 2022-08-13 DIAGNOSIS — R7989 Other specified abnormal findings of blood chemistry: Secondary | ICD-10-CM | POA: Diagnosis not present

## 2022-08-13 DIAGNOSIS — E559 Vitamin D deficiency, unspecified: Secondary | ICD-10-CM | POA: Diagnosis not present

## 2022-08-13 MED ORDER — VALSARTAN-HYDROCHLOROTHIAZIDE 80-12.5 MG PO TABS: 1.0000 | ORAL_TABLET | Freq: Every day | ORAL | 3 refills | Status: DC

## 2022-08-13 MED ORDER — LEVONORGEST-ETH ESTRAD 91-DAY 0.15-0.03 MG PO TABS: 1.0000 | ORAL_TABLET | Freq: Every day | ORAL | 3 refills | Status: DC

## 2022-08-13 MED ORDER — NEBIVOLOL HCL 2.5 MG PO TABS: 2.5000 mg | ORAL_TABLET | Freq: Every day | ORAL | 3 refills | Status: DC

## 2022-08-13 NOTE — Patient Instructions (Addendum)
I will let you know what your labs show. I have sent in refills for one year.    We can plan to see you back in a year and we can do your physical at the same time as your blood pressure check and birth control refills.   If you need me in the meantime, please let me know.

## 2022-08-13 NOTE — Progress Notes (Signed)
Worthy Keeler, DNP, AGNP-c [place]  [address] [place], [place] [address] [phone]  ESTABLISHED PATIENT- Chronic Health and/or Follow-Up Visit  Blood pressure 122/80, pulse 76, height _0  (1.626 m), weight 203 lb 12.8 oz (92.4 kg), last menstrual period 05/20/2022.    Julie Lucas is a 41 y.o. year old female presenting today for evaluation and management of the following:  Hypertension, follow-up  BP Readings from Last 3 Encounters:  08/13/22 122/80  03/30/22 132/74  02/18/22 122/82   Wt Readings from Last 3 Encounters:  08/13/22 203 lb 12.8 oz (92.4 kg)  03/30/22 196 lb (88.9 kg)  02/18/22 193 lb 1.6 oz (87.6 kg)     She reports excellent compliance with treatment. She is not having side effects.  She is following a Regular diet. She is exercising. She does not smoke.  Use of agents associated with hypertension: none.   Outside blood pressures are stable. Symptoms: No chest pain No chest pressure  No palpitations No syncope  No dyspnea No orthopnea  No paroxysmal nocturnal dyspnea No lower extremity edema   Pertinent labs Lab Results  Component Value Date   CHOL 175 08/14/2021   HDL 42 08/14/2021   LDLCALC 113 (H) 08/14/2021   TRIG 109 08/14/2021   CHOLHDL 4.2 08/14/2021   Lab Results  Component Value Date   NA 136 08/13/2022   K 4.7 08/13/2022   CREATININE 0.72 08/13/2022   EGFR 109 08/13/2022   GLUCOSE 78 08/13/2022   TSH 0.885 08/14/2021     The ASCVD Risk score (Arnett DK, et al., 2019) failed to calculate for the following reasons:   The 2019 ASCVD risk score is only valid for ages 66 to 49  --------------------------------------------------------------------------------------------------- She would like a refill on her OCP.   All ROS negative with exception of what is listed above.   PHYSICAL EXAM Physical Exam Vitals and nursing note reviewed.  Constitutional:      General: She is not in acute  distress.    Appearance: Normal appearance.  HENT:     Head: Normocephalic.  Eyes:     Extraocular Movements: Extraocular movements intact.     Conjunctiva/sclera: Conjunctivae normal.     Pupils: Pupils are equal, round, and reactive to light.  Neck:     Vascular: No carotid bruit.  Cardiovascular:     Rate and Rhythm: Normal rate and regular rhythm.     Pulses: Normal pulses.     Heart sounds: Normal heart sounds. No murmur heard. Pulmonary:     Effort: Pulmonary effort is normal.     Breath sounds: Normal breath sounds. No wheezing.  Abdominal:     General: Bowel sounds are normal. There is no distension.     Palpations: Abdomen is soft.     Tenderness: There is no abdominal tenderness. There is no guarding.  Musculoskeletal:        General: Normal range of motion.     Cervical back: Normal range of motion and neck supple.     Right lower leg: No edema.     Left lower leg: No edema.  Lymphadenopathy:     Cervical: No cervical adenopathy.  Skin:    General: Skin is warm and dry.     Capillary Refill: Capillary refill takes less than 2 seconds.  Neurological:     General: No focal deficit present.     Mental Status: She is alert and oriented to person, place, and time.  Psychiatric:  Mood and Affect: Mood normal.        Behavior: Behavior normal.        Thought Content: Thought content normal.        Judgment: Judgment normal.     PLAN Problem List Items Addressed This Visit     Essential hypertension - Primary    Chronic. Stable. No alarm symptoms at this time. Continue current management. CMP ordered today. F/U in 6 months or sooner if there are changes.       Relevant Medications   valsartan-hydrochlorothiazide (DIOVAN-HCT) 80-12.5 MG tablet   nebivolol (BYSTOLIC) 2.5 MG tablet   Other Relevant Orders   Comprehensive metabolic panel (Completed)   Inappropriate sinus tachycardia    Stable. No alarm sx. No changes to plan of care.       Relevant  Medications   valsartan-hydrochlorothiazide (DIOVAN-HCT) 80-12.5 MG tablet   nebivolol (BYSTOLIC) 2.5 MG tablet   Other Relevant Orders   Comprehensive metabolic panel (Completed)   VITAMIN D 25 Hydroxy (Vit-D Deficiency, Fractures) (Completed)   Abnormal liver function tests    Will repeat CMP today for monitoring.       Relevant Medications   nebivolol (BYSTOLIC) 2.5 MG tablet   Other Relevant Orders   Comprehensive metabolic panel (Completed)   Encounter for surveillance of contraceptive pills    Refills provided for 12 months. No concerns at this time.        Relevant Medications   levonorgestrel-ethinyl estradiol (SEASONALE) 0.15-0.03 MG tablet   Vitamin D deficiency    Labs pending.       Relevant Orders   Comprehensive metabolic panel (Completed)   VITAMIN D 25 Hydroxy (Vit-D Deficiency, Fractures) (Completed)    Return in about 1 year (around 08/14/2023) for CPE and HTN.   Worthy Keeler, DNP, AGNP-c 08/13/2022  9:31 AM

## 2022-08-14 LAB — COMPREHENSIVE METABOLIC PANEL
ALT: 27 IU/L (ref 0–32)
AST: 21 IU/L (ref 0–40)
Albumin/Globulin Ratio: 1.4 (ref 1.2–2.2)
Albumin: 3.9 g/dL (ref 3.9–4.9)
Alkaline Phosphatase: 52 IU/L (ref 44–121)
BUN/Creatinine Ratio: 11 (ref 9–23)
BUN: 8 mg/dL (ref 6–20)
Bilirubin Total: 0.9 mg/dL (ref 0.0–1.2)
CO2: 22 mmol/L (ref 20–29)
Calcium: 9.4 mg/dL (ref 8.7–10.2)
Chloride: 102 mmol/L (ref 96–106)
Creatinine, Ser: 0.72 mg/dL (ref 0.57–1.00)
Globulin, Total: 2.7 g/dL (ref 1.5–4.5)
Glucose: 78 mg/dL (ref 70–99)
Potassium: 4.7 mmol/L (ref 3.5–5.2)
Sodium: 136 mmol/L (ref 134–144)
Total Protein: 6.6 g/dL (ref 6.0–8.5)
eGFR: 109 mL/min/{1.73_m2} (ref >59)

## 2022-08-14 LAB — VITAMIN D 25 HYDROXY (VIT D DEFICIENCY, FRACTURES): Vit D, 25-Hydroxy: 54.7 ng/mL (ref 30.0–100.0)

## 2022-08-24 NOTE — Assessment & Plan Note (Signed)
Labs pending.  

## 2022-08-24 NOTE — Assessment & Plan Note (Signed)
Will repeat CMP today for monitoring.

## 2022-08-24 NOTE — Assessment & Plan Note (Signed)
Refills provided for 12 months. No concerns at this time.

## 2022-08-24 NOTE — Assessment & Plan Note (Signed)
Chronic. Stable. No alarm symptoms at this time. Continue current management. CMP ordered today. F/U in 6 months or sooner if there are changes.

## 2022-08-24 NOTE — Assessment & Plan Note (Signed)
Stable. No alarm sx. No changes to plan of care.

## 2022-12-02 ENCOUNTER — Encounter: Payer: Self-pay | Admitting: Nurse Practitioner

## 2022-12-03 ENCOUNTER — Other Ambulatory Visit: Payer: Self-pay

## 2022-12-03 DIAGNOSIS — E663 Overweight: Secondary | ICD-10-CM

## 2022-12-23 ENCOUNTER — Encounter: Payer: Self-pay | Admitting: Nurse Practitioner

## 2023-03-25 ENCOUNTER — Encounter (INDEPENDENT_AMBULATORY_CARE_PROVIDER_SITE_OTHER): Payer: Self-pay

## 2023-08-16 ENCOUNTER — Ambulatory Visit (INDEPENDENT_AMBULATORY_CARE_PROVIDER_SITE_OTHER): Payer: 59 | Admitting: Nurse Practitioner

## 2023-08-16 ENCOUNTER — Encounter: Payer: Self-pay | Admitting: Nurse Practitioner

## 2023-08-16 VITALS — BP 138/88 | HR 72 | Ht 64.0 in | Wt 195.6 lb

## 2023-08-16 DIAGNOSIS — E559 Vitamin D deficiency, unspecified: Secondary | ICD-10-CM

## 2023-08-16 DIAGNOSIS — Z1231 Encounter for screening mammogram for malignant neoplasm of breast: Secondary | ICD-10-CM

## 2023-08-16 DIAGNOSIS — I10 Essential (primary) hypertension: Secondary | ICD-10-CM | POA: Diagnosis not present

## 2023-08-16 DIAGNOSIS — Z3041 Encounter for surveillance of contraceptive pills: Secondary | ICD-10-CM | POA: Diagnosis not present

## 2023-08-16 DIAGNOSIS — J309 Allergic rhinitis, unspecified: Secondary | ICD-10-CM

## 2023-08-16 DIAGNOSIS — R7989 Other specified abnormal findings of blood chemistry: Secondary | ICD-10-CM | POA: Diagnosis not present

## 2023-08-16 DIAGNOSIS — Z0000 Encounter for general adult medical examination without abnormal findings: Secondary | ICD-10-CM | POA: Diagnosis not present

## 2023-08-16 LAB — LIPID PANEL

## 2023-08-16 MED ORDER — LEVONORGEST-ETH ESTRAD 91-DAY 0.15-0.03 MG PO TABS: 1.0000 | ORAL_TABLET | Freq: Every day | ORAL | 3 refills | Status: DC

## 2023-08-16 MED ORDER — NEBIVOLOL HCL 2.5 MG PO TABS: 2.5000 mg | ORAL_TABLET | Freq: Every day | ORAL | 3 refills | Status: DC

## 2023-08-16 MED ORDER — VALSARTAN-HYDROCHLOROTHIAZIDE 80-12.5 MG PO TABS: 1.0000 | ORAL_TABLET | Freq: Every day | ORAL | 3 refills | Status: DC

## 2023-08-16 NOTE — Assessment & Plan Note (Signed)
Blood pressure initially 140/90 mmHg, improved to 138/88 mmHg on recheck. Currently on Bystolic (nebivolol) and valsartan hydrochlorothiazide. No symptoms of headaches, vision changes, or dizziness. Likely stress-related elevation due to recent family loss and holiday season. Discussed home monitoring and potential medication adjustments if readings remain elevated. Emphasized stress management and regular monitoring. - Monitor blood pressure at home daily for the next few weeks - Send MyChart message on December 30th to follow up on blood pressure readings - Adjust medications if blood pressure remains elevated above 120/80 mmHg

## 2023-08-16 NOTE — Assessment & Plan Note (Signed)

## 2023-08-16 NOTE — Assessment & Plan Note (Signed)
Will repeat CMP today for monitoring.

## 2023-08-16 NOTE — Patient Instructions (Addendum)
Your blood pressure was a little high today. Please keep an eye on that over the next few weeks and send me a note to let me know what it looks like. I sent you a MyChart message to come to you at the end of the year as a reminder to check these. We will focus on how they look after the holidays. If you are getting readings in the 150's on top or 100's on bottom let me know before then.  Florastor is a trusted probiotic for gut health. You can also try taking just an acidophilus supplement, which is often less expensive. I have many patients who also have good luck with align.   Keep up the great work with piliates! That's awesome!!!

## 2023-08-16 NOTE — Progress Notes (Signed)
Shawna Clamp, DNP, AGNP-c [facility] [address] [place], [place] [address] Main Office [phone]  BP 138/88 (Cuff Size: Normal)   Pulse 72   Ht 5\' 4"  (1.626 m)   Wt 195 lb 9.6 oz (88.7 kg)   BMI 33.57 kg/m    Subjective:    Patient ID: Julie Lucas, female    DOB: [birth year], 42 y.o.   MRN: [id number]  HPI: Julie Lucas is a 42 y.o. female presenting on 08/16/2023 for comprehensive medical examination.   History of Present Illness Julie Lucas, a 42 year old female, presents for an annual physical exam. She has a history of hypertension, managed with Bystolic and Valsartan-Hydrochlorothiazide, and has been experiencing elevated blood pressure readings. She denies any associated symptoms such as headaches, vision changes, or dizziness. She also takes birth control and reports no issues with her menstrual periods.  Julie Lucas has been proactive about her health, recently starting Pilates classes, and is interested in starting a probiotic for gut health. She has no concerns with her hearing or vision and denies any changes in bowel or bladder habits. She denies any chest pain, shortness of breath, or palpitations. She also denies any swelling in her feet or ankles.  She has been dealing with allergies, managed with Xyzal, and reports it to be effective most of the time. She also takes a weekly dose of Vitamin D. She has not yet had a mammogram and is due for one. She has not had any changes in her medication regimen.  Julie Lucas has been under significant stress recently, with the loss of a cousin and the holiday season. She is also concerned about her elevated blood pressure readings.  Pertinent items are noted in HPI.  IMMUNIZATIONS:   Flu Vaccine: Flu vaccine declined, patient does not wish to complete Prevnar 13: Prevnar 13 N/A for this patient Prevnar 20: Prevnar 20 N/A for this patient Pneumovax 23: Pneumovax 23 N/A for this patient Vac  Shingrix: Shingrix N/A for this patient HPV: N/A or Aged Out Tetanus: Tetanus declined, patient will complete at a later date COVID: Declined today. Information on where to obtain the vaccine was provided.  RSV: No  HEALTH MAINTENANCE: Pap Smear HM Status: is up to date Mammogram HM Status: was ordered today Colon Cancer Screening HM Status: N/A Bone Density HM Status: N/A STI Testing HM Status: was declined  Lung CT HM Status: N/A  Concerns with vision, hearing, or dentition: No  Most Recent Depression Screen:     08/16/2023    8:20 AM 08/14/2021    3:09 PM 01/09/2020   10:05 AM 07/17/2019    8:30 AM 12/28/2018    4:04 PM  Depression screen PHQ 2/9  Decreased Interest 0 0 0 0 0  Down, Depressed, Hopeless 0 0 0 0 0  PHQ - 2 Score 0 0 0 0 0   Most Recent Anxiety Screen:     08/14/2021    3:10 PM  GAD 7 : Generalized Anxiety Score  Nervous, Anxious, on Edge 0  Control/stop worrying 0  Worry too much - different things 1  Trouble relaxing 0  Restless 0  Easily annoyed or irritable 0  Afraid - awful might happen 0  Total GAD 7 Score 1  Anxiety Difficulty Not difficult at all   Most Recent Fall Screen:    08/16/2023    8:20 AM 08/14/2021    3:09 PM 01/09/2020   10:04 AM 07/17/2019    8:30 AM 12/28/2018    4:04  PM  Fall Risk   Falls in the past year? 0 0 0 0 0  Number falls in past yr: 0 0 0 0 0  Injury with Fall? 0 0 0 0 0  Risk for fall due to : No Fall Risks No Fall Risks     Follow up Falls evaluation completed Falls evaluation completed;Education provided       Past medical history, surgical history, medications, allergies, family history and social history reviewed with patient today and changes made to appropriate areas of the chart.  Past Medical History:  Past Medical History:  Diagnosis Date   Allergic reaction 03/05/2016   Allergic urticaria 03/05/2016   Essential hypertension 11/19/2016   Hypertension    Inappropriate sinus tachycardia (HCC)  11/19/2016   Intermittent coughing 03/05/2016   Medications:  Current Outpatient Medications on File Prior to Visit  Medication Sig   Cholecalciferol (VITAMIN D) 125 MCG (5000 UT) CAPS Take 1 tablet by mouth daily.   levocetirizine (XYZAL) 2.5 MG/5ML solution Take 2.5 mg by mouth every evening.   No current facility-administered medications on file prior to visit.   Surgical History:  Past Surgical History:  Procedure Laterality Date   thumb surgery     Allergies:  Allergies  Allergen Reactions   Augmentin [Amoxicillin-Pot Clavulanate] Nausea And Vomiting   Lisinopril Swelling   Sulfa Antibiotics Hives   Family History:  Family History  Problem Relation Age of Onset   Allergic rhinitis Mother    Hypertension Mother    Allergic rhinitis Father    Hypertension Father    Hypertension Brother    Diabetes Maternal Grandfather    Diabetes Paternal Grandfather    Angioedema Neg Hx    Asthma Neg Hx    Eczema Neg Hx    Immunodeficiency Neg Hx    Urticaria Neg Hx        Objective:    BP 138/88 (Cuff Size: Normal)   Pulse 72   Ht 5\' 4"  (1.626 m)   Wt 195 lb 9.6 oz (88.7 kg)   BMI 33.57 kg/m   Wt Readings from Last 3 Encounters:  08/16/23 195 lb 9.6 oz (88.7 kg)  08/13/22 203 lb 12.8 oz (92.4 kg)  03/30/22 196 lb (88.9 kg)    Physical Exam Vitals and nursing note reviewed.  Constitutional:      General: She is not in acute distress.    Appearance: Normal appearance.  HENT:     Head: Normocephalic and atraumatic.     Right Ear: Hearing, tympanic membrane, ear canal and external ear normal.     Left Ear: Hearing, tympanic membrane, ear canal and external ear normal.     Nose: Nose normal.     Right Sinus: No maxillary sinus tenderness or frontal sinus tenderness.     Left Sinus: No maxillary sinus tenderness or frontal sinus tenderness.     Mouth/Throat:     Lips: Pink.     Mouth: Mucous membranes are moist.     Pharynx: Oropharynx is clear.  Eyes:      General: Lids are normal. Vision grossly intact.     Extraocular Movements: Extraocular movements intact.     Conjunctiva/sclera: Conjunctivae normal.     Pupils: Pupils are equal, round, and reactive to light.     Funduscopic exam:    Right eye: Red reflex present.        Left eye: Red reflex present.    Visual Fields: Right eye visual fields normal and  left eye visual fields normal.  Neck:     Thyroid: No thyromegaly.     Vascular: No carotid bruit.  Cardiovascular:     Rate and Rhythm: Normal rate and regular rhythm.     Chest Wall: PMI is not displaced.     Pulses: Normal pulses.          Dorsalis pedis pulses are 2+ on the right side and 2+ on the left side.       Posterior tibial pulses are 2+ on the right side and 2+ on the left side.     Heart sounds: Normal heart sounds. No murmur heard. Pulmonary:     Effort: Pulmonary effort is normal. No respiratory distress.     Breath sounds: Normal breath sounds.  Abdominal:     General: Abdomen is flat. Bowel sounds are normal. There is no distension.     Palpations: Abdomen is soft. There is no hepatomegaly, splenomegaly or mass.     Tenderness: There is no abdominal tenderness. There is no right CVA tenderness, left CVA tenderness, guarding or rebound.  Musculoskeletal:        General: Normal range of motion.     Cervical back: Full passive range of motion without pain, normal range of motion and neck supple. No tenderness.     Right lower leg: No edema.     Left lower leg: No edema.  Feet:     Left foot:     Toenail Condition: Left toenails are normal.  Lymphadenopathy:     Cervical: No cervical adenopathy.     Upper Body:     Right upper body: No supraclavicular adenopathy.     Left upper body: No supraclavicular adenopathy.  Skin:    General: Skin is warm and dry.     Capillary Refill: Capillary refill takes less than 2 seconds.     Nails: There is no clubbing.  Neurological:     General: No focal deficit present.      Mental Status: She is alert and oriented to person, place, and time.     GCS: GCS eye subscore is 4. GCS verbal subscore is 5. GCS motor subscore is 6.     Sensory: Sensation is intact.     Motor: Motor function is intact. No weakness.     Coordination: Coordination is intact.     Gait: Gait is intact.     Deep Tendon Reflexes: Reflexes are normal and symmetric.  Psychiatric:        Attention and Perception: Attention normal.        Mood and Affect: Mood normal.        Speech: Speech normal.        Behavior: Behavior normal. Behavior is cooperative.        Thought Content: Thought content normal.        Cognition and Memory: Cognition and memory normal.        Judgment: Judgment normal.      Results for orders placed or performed in visit on 12/23/22  HM PAP SMEAR   Collection Time: 01/09/22 12:00 AM  Result Value Ref Range   HM Pap smear negative   Results Console HPV   Collection Time: 01/09/22 12:00 AM  Result Value Ref Range   CHL HPV Negative        Assessment & Plan:   Problem List Items Addressed This Visit     Allergic rhinitis   Symptoms managed with Xyzal, no changes needed. -  Continue Xyzal for allergy management      Essential hypertension   Blood pressure initially 140/90 mmHg, improved to 138/88 mmHg on recheck. Currently on Bystolic (nebivolol) and valsartan hydrochlorothiazide. No symptoms of headaches, vision changes, or dizziness. Likely stress-related elevation due to recent family loss and holiday season. Discussed home monitoring and potential medication adjustments if readings remain elevated. Emphasized stress management and regular monitoring. - Monitor blood pressure at home daily for the next few weeks - Send MyChart message on December 30th to follow up on blood pressure readings - Adjust medications if blood pressure remains elevated above 120/80 mmHg      Relevant Medications   nebivolol (BYSTOLIC) 2.5 MG tablet    valsartan-hydrochlorothiazide (DIOVAN-HCT) 80-12.5 MG tablet   Other Relevant Orders   CBC with Differential/Platelet   CMP14+EGFR   Hemoglobin A1c   Lipid panel   Abnormal liver function tests   Will repeat CMP today for monitoring.       Relevant Orders   CBC with Differential/Platelet   CMP14+EGFR   Hemoglobin A1c   Lipid panel   Encounter for surveillance of contraceptive pills   No missed doses. Routine menses. STD testing declined today. Refills provided.      Relevant Medications   levonorgestrel-ethinyl estradiol (SEASONALE) 0.15-0.03 MG tablet   Encounter for annual physical exam - Primary   CPE completed today. Review of HM activities and recommendations discussed and provided on AVS. Anticipatory guidance, diet, and exercise recommendations provided. Medications, allergies, and hx reviewed and updated as necessary. Orders placed as listed below.  Plan: - Labs ordered. Will make changes as necessary based on results.  - I will review these results and send recommendations via MyChart or a telephone call.  - F/U with CPE in 1 year or sooner for acute/chronic health needs as directed.        Relevant Orders   CBC with Differential/Platelet   CMP14+EGFR   Hemoglobin A1c   Lipid panel   MM 3D SCREENING MAMMOGRAM BILATERAL BREAST   Vitamin D deficiency   Relevant Orders   VITAMIN D 25 Hydroxy (Vit-D Deficiency, Fractures)   Other Visit Diagnoses       Screening mammogram for breast cancer       Relevant Orders   MM 3D SCREENING MAMMOGRAM BILATERAL BREAST         Follow up plan: Return in about 6 months (around 02/14/2024) for Med Management 30-HTN.  NEXT PREVENTATIVE PHYSICAL DUE IN 1 YEAR.  PATIENT COUNSELING PROVIDED FOR ALL ADULT PATIENTS: A well balanced diet low in saturated fats, cholesterol, and moderation in carbohydrates.  This can be as simple as monitoring portion sizes and cutting back on sugary beverages such as soda and juice to start with.     Daily water consumption of at least 64 ounces.  Physical activity at least 180 minutes per week.  If just starting out, start 10 minutes a day and work your way up.   This can be as simple as taking the stairs instead of the elevator and walking 2-3 laps around the office  purposefully every day.   STD protection, partner selection, and regular testing if high risk.  Limited consumption of alcoholic beverages if alcohol is consumed. For men, I recommend no more than 14 alcoholic beverages per week, spread out throughout the week (max 2 per day). Avoid "binge" drinking or consuming large quantities of alcohol in one setting.  Please let me know if you feel you may need help  with reduction or quitting alcohol consumption.   Avoidance of nicotine, if used. Please let me know if you feel you may need help with reduction or quitting nicotine use.   Daily mental health attention. This can be in the form of 5 minute daily meditation, prayer, journaling, yoga, reflection, etc.  Purposeful attention to your emotions and mental state can significantly improve your overall wellbeing  and  Health.  Please know that I am here to help you with all of your health care goals and am happy to work with you to find a solution that works best for you.  The greatest advice I have received with any changes in life are to take it one step at a time, that even means if all you can focus on is the next 60 seconds, then do that and celebrate your victories.  With any changes in life, you will have set backs, and that is OK. The important thing to remember is, if you have a set back, it is not a failure, it is an opportunity to try again! Screening Testing Mammogram Every 1 -2 years based on history and risk factors Starting at age 65 Pap Smear Ages 21-39 every 3 years Ages 19-65 every 5 years with HPV testing More frequent testing may be required based on results and history Colon Cancer Screening Every  1-10 years based on test performed, risk factors, and history Starting at age 31 Bone Density Screening Every 2-10 years based on history Starting at age 22 for women Recommendations for men differ based on medication usage, history, and risk factors AAA Screening One time ultrasound Men 68-60 years old who have every smoked Lung Cancer Screening Low Dose Lung CT every 12 months Age 73-80 years with a 30 pack-year smoking history who still smoke or who have quit within the last 15 years   Screening Labs Routine  Labs: Complete Blood Count (CBC), Complete Metabolic Panel (CMP), Cholesterol (Lipid Panel) Every 6-12 months based on history and medications May be recommended more frequently based on current conditions or previous results Hemoglobin A1c Lab Every 3-12 months based on history and previous results Starting at age 43 or earlier with diagnosis of diabetes, high cholesterol, BMI >26, and/or risk factors Frequent monitoring for patients with diabetes to ensure blood sugar control Thyroid Panel (TSH) Every 6 months based on history, symptoms, and risk factors May be repeated more often if on medication HIV One time testing for all patients 62 and older May be repeated more frequently for patients with increased risk factors or exposure Hepatitis C One time testing for all patients 15 and older May be repeated more frequently for patients with increased risk factors or exposure Gonorrhea, Chlamydia Every 12 months for all sexually active persons 13-24 years Additional monitoring may be recommended for those who are considered high risk or who have symptoms Every 12 months for any woman on birth control, regardless of sexual activity PSA Men 4-60 years old with risk factors Additional screening may be recommended from age 36-69 based on risk factors, symptoms, and history  Vaccine Recommendations Tetanus Booster All adults every 10 years Flu Vaccine All patients 6  months and older every year COVID Vaccine All patients 12 years and older Initial dosing with booster May recommend additional booster based on age and health history HPV Vaccine 2 doses all patients age 57-26 Dosing may be considered for patients over 26 Shingles Vaccine (Shingrix) 2 doses all adults 55 years and older Pneumonia (Pneumovax  23) All adults 65 years and older May recommend earlier dosing based on health history One year apart from Prevnar 13 Pneumonia (Prevnar 59) All adults 65 years and older Dosed 1 year after Pneumovax 23 Pneumonia (Prevnar 20) One time alternative to the two dosing of 13 and 23 For all adults with initial dose of 23, 20 is recommended 1 year later For all adults with initial dose of 13, 23 is still recommended as second option 1 year later

## 2023-08-16 NOTE — Assessment & Plan Note (Signed)
No missed doses. Routine menses. STD testing declined today. Refills provided.

## 2023-08-16 NOTE — Assessment & Plan Note (Signed)
Symptoms managed with Xyzal, no changes needed. - Continue Xyzal for allergy management

## 2023-08-17 LAB — LIPID PANEL
Cholesterol, Total: 178 mg/dL (ref 100–199)
HDL: 48 mg/dL (ref >39)
LDL CALC COMMENT:: 3.7 ratio (ref 0.0–4.4)
LDL Chol Calc (NIH): 111 mg/dL — ABNORMAL HIGH (ref 0–99)
Triglycerides: 107 mg/dL (ref 0–149)
VLDL Cholesterol Cal: 19 mg/dL (ref 5–40)

## 2023-08-17 LAB — CMP14+EGFR
ALT: 31 IU/L (ref 0–32)
AST: 23 IU/L (ref 0–40)
Albumin: 4.2 g/dL (ref 3.9–4.9)
Alkaline Phosphatase: 61 IU/L (ref 44–121)
BUN/Creatinine Ratio: 9 (ref 9–23)
BUN: 8 mg/dL (ref 6–24)
Bilirubin Total: 0.8 mg/dL (ref 0.0–1.2)
CO2: 21 mmol/L (ref 20–29)
Calcium: 9.8 mg/dL (ref 8.7–10.2)
Chloride: 100 mmol/L (ref 96–106)
Creatinine, Ser: 0.88 mg/dL (ref 0.57–1.00)
Globulin, Total: 2.9 g/dL (ref 1.5–4.5)
Glucose: 87 mg/dL (ref 70–99)
Potassium: 4.5 mmol/L (ref 3.5–5.2)
Sodium: 137 mmol/L (ref 134–144)
Total Protein: 7.1 g/dL (ref 6.0–8.5)
eGFR: 85 mL/min/{1.73_m2} (ref >59)

## 2023-08-17 LAB — CBC WITH DIFFERENTIAL/PLATELET
Basophils Absolute: 0 10*3/uL (ref 0.0–0.2)
Basos: 0 %
EOS (ABSOLUTE): 0.1 10*3/uL (ref 0.0–0.4)
Eos: 1 %
Hematocrit: 42.4 % (ref 34.0–46.6)
Hemoglobin: 13.9 g/dL (ref 11.1–15.9)
Immature Grans (Abs): 0 10*3/uL (ref 0.0–0.1)
Immature Granulocytes: 0 %
Lymphocytes Absolute: 1.1 10*3/uL (ref 0.7–3.1)
Lymphs: 22 %
MCH: 29.8 pg (ref 26.6–33.0)
MCHC: 32.8 g/dL (ref 31.5–35.7)
MCV: 91 fL (ref 79–97)
Monocytes Absolute: 0.4 10*3/uL (ref 0.1–0.9)
Monocytes: 9 %
Neutrophils Absolute: 3.3 10*3/uL (ref 1.4–7.0)
Neutrophils: 68 %
Platelets: 387 10*3/uL (ref 150–450)
RBC: 4.66 x10E6/uL (ref 3.77–5.28)
RDW: 12.1 % (ref 11.7–15.4)
WBC: 4.9 10*3/uL (ref 3.4–10.8)

## 2023-08-17 LAB — HEMOGLOBIN A1C
Est. average glucose Bld gHb Est-mCnc: 105 mg/dL
Hgb A1c MFr Bld: 5.3 % (ref 4.8–5.6)

## 2023-08-17 LAB — VITAMIN D 25 HYDROXY (VIT D DEFICIENCY, FRACTURES): Vit D, 25-Hydroxy: 65.3 ng/mL (ref 30.0–100.0)

## 2023-09-10 ENCOUNTER — Ambulatory Visit
Admission: RE | Admit: 2023-09-10 | Discharge: 2023-09-10 | Disposition: A | Discharge status: Home / Self Care | Payer: 59 | Source: Ambulatory Visit | Attending: Nurse Practitioner | Admitting: Nurse Practitioner

## 2023-09-10 DIAGNOSIS — Z1231 Encounter for screening mammogram for malignant neoplasm of breast: Secondary | ICD-10-CM

## 2023-09-10 DIAGNOSIS — Z0000 Encounter for general adult medical examination without abnormal findings: Secondary | ICD-10-CM

## 2023-09-14 ENCOUNTER — Other Ambulatory Visit: Payer: Self-pay | Admitting: Nurse Practitioner

## 2023-09-14 ENCOUNTER — Telehealth: Payer: Self-pay

## 2023-09-14 ENCOUNTER — Encounter: Payer: Self-pay | Admitting: Nurse Practitioner

## 2023-09-14 DIAGNOSIS — R928 Other abnormal and inconclusive findings on diagnostic imaging of breast: Secondary | ICD-10-CM

## 2023-09-14 NOTE — Telephone Encounter (Signed)
Copied from CRM [id number]. Topic: General - Other >> Sep 14, 2023  9:23 AM Julie Lucas wrote: Reason for CRM: patient is calling In because she has a mammogram 09/15/23 and needs paperwork signed by provider

## 2023-09-14 NOTE — Telephone Encounter (Signed)
[facility] called regarding pt orders. States pt is coming in for follow up imaging tomorrow (09/15/23) and they need [place] Dec to sign off on the orders already in the system.

## 2023-09-15 ENCOUNTER — Ambulatory Visit
Admission: RE | Admit: 2023-09-15 | Discharge: 2023-09-15 | Discharge status: Home / Self Care | Payer: 59 | Source: Ambulatory Visit | Attending: Nurse Practitioner

## 2023-09-15 ENCOUNTER — Ambulatory Visit
Admission: RE | Admit: 2023-09-15 | Discharge: 2023-09-15 | Disposition: A | Discharge status: Home / Self Care | Payer: 59 | Source: Ambulatory Visit | Attending: Nurse Practitioner | Admitting: Nurse Practitioner

## 2023-09-15 ENCOUNTER — Other Ambulatory Visit: Payer: Self-pay | Admitting: Nurse Practitioner

## 2023-09-15 DIAGNOSIS — N6310 Unspecified lump in the right breast, unspecified quadrant: Secondary | ICD-10-CM

## 2023-09-15 DIAGNOSIS — R928 Other abnormal and inconclusive findings on diagnostic imaging of breast: Secondary | ICD-10-CM

## 2023-09-15 DIAGNOSIS — N6489 Other specified disorders of breast: Secondary | ICD-10-CM

## 2023-09-16 ENCOUNTER — Encounter: Payer: Self-pay | Admitting: Nurse Practitioner

## 2024-02-15 ENCOUNTER — Encounter: Payer: 59 | Admitting: Nurse Practitioner

## 2024-02-23 ENCOUNTER — Encounter: Payer: Self-pay | Admitting: Nurse Practitioner

## 2024-02-23 ENCOUNTER — Ambulatory Visit: Payer: 59 | Admitting: Nurse Practitioner

## 2024-02-23 VITALS — BP 142/92 | HR 88 | Ht 64.0 in | Wt 192.0 lb

## 2024-02-23 DIAGNOSIS — N63 Unspecified lump in unspecified breast: Secondary | ICD-10-CM

## 2024-02-23 DIAGNOSIS — R7989 Other specified abnormal findings of blood chemistry: Secondary | ICD-10-CM | POA: Diagnosis not present

## 2024-02-23 DIAGNOSIS — I10 Essential (primary) hypertension: Secondary | ICD-10-CM

## 2024-02-23 NOTE — Patient Instructions (Signed)
 VISIT SUMMARY:  Today, we discussed your concerns about elevated blood pressure and the findings from your recent mammogram. Your blood pressure was high during the visit, which you believe may be due to anxiety and the use of a larger cuff. We also reviewed the two spots identified in your mammogram, which are likely benign but will be monitored closely.  YOUR PLAN:  -HYPERTENSION: Your blood pressure was elevated at 142/92 mmHg, likely due to stress from your recent mammogram findings. You are currently on a low dose of medication, which you tolerate well. We discussed the possibility of increasing your dosage if necessary. Please monitor your blood pressure at home in the morning and evening for four days and report the readings on Monday. We will evaluate these readings to determine if a medication adjustment is needed. Additionally, we will monitor your kidney and liver function.  -GENERAL HEALTH MAINTENANCE: Regular mammograms are important for Julie Lucas detection of potential issues, even though they can sometimes cause anxiety. Mammograms are effective in identifying both benign and potentially serious anomalies that require monitoring. Please continue with regular mammogram screenings as recommended.  INSTRUCTIONS:  Please monitor your blood pressure at home in the morning and evening for four days and report the readings on Monday. Your follow-up mammogram is scheduled for July 24th to monitor the identified masses. We will evaluate your home blood pressure readings to determine if any medication adjustments are needed.

## 2024-02-23 NOTE — Progress Notes (Signed)
 Catheline Doing, DNP, AGNP-c [facility]  [address] [place], [place] [address] [phone]  ESTABLISHED PATIENT- Chronic Health and/or Follow-Up Visit  Blood pressure (!) 142/92, pulse 88, height 5' 4 (1.626 m), weight 192 lb (87.1 kg), SpO2 98%.    History of Present Illness Julie Lucas is a 43 year old female who presents with concerns about her blood pressure and follow-up on mammogram findings.  Her blood pressure was elevated during today's visit. She has not been regularly monitoring her blood pressure at home. No symptoms such as headaches or vision changes. She attributes the elevated reading to anxiety and the use of a larger cuff during measurement. She is on a low dose of medication for blood pressure management.  She is concerned about two spots identified in her recent mammogram. A follow-up mammogram is scheduled for July 24th to monitor any changes. This is her first mammogram, and the findings have caused significant anxiety, which she believes may be affecting her blood pressure.  She did not undergo surgery for a previous arm issue, which resolved with physical therapy and rest. She experienced a foot fracture earlier this year, which has since healed.  No recent caffeine intake and she reports staying hydrated. No headaches, vision changes, or swelling in her feet.    All ROS negative with exception of what is listed above.   PHYSICAL EXAM Physical Exam Vitals and nursing note reviewed.  Constitutional:      Appearance: Normal appearance.  HENT:     Head: Normocephalic.  Eyes:     Pupils: Pupils are equal, round, and reactive to light.  Neck:     Vascular: No carotid bruit.  Cardiovascular:     Rate and Rhythm: Normal rate and regular rhythm.     Pulses: Normal pulses.     Heart sounds: Normal heart sounds.  Pulmonary:     Effort: Pulmonary effort is normal.     Breath sounds: Normal breath sounds.  Musculoskeletal:         General: Normal range of motion.     Cervical back: Normal range of motion.     Right lower leg: No edema.     Left lower leg: No edema.  Skin:    General: Skin is warm.  Neurological:     General: No focal deficit present.     Mental Status: She is alert and oriented to person, place, and time.  Psychiatric:        Mood and Affect: Mood normal.        Behavior: Behavior normal.      PLAN Problem List Items Addressed This Visit     Mass of breast   Essential hypertension - Primary   Relevant Orders   CBC with Differential/Platelet (Completed)   Comprehensive metabolic panel with GFR (Completed)   Abnormal liver function tests   Relevant Orders   CBC with Differential/Platelet (Completed)   Comprehensive metabolic panel with GFR (Completed)   Assessment and Plan Breast Masses (BI-RADS 3) Two masses identified on mammogram, one as fibroglandular tissue and the other as a cyst-like structure, both classified as BI-RADS 3 with a greater than 98% chance of being benign. She is anxious about the findings but reassured of her likely benign nature. Removal is not recommended due to potential scar tissue formation, which could complicate future assessments. Follow-up plan includes monitoring and possible fine needle aspiration if changes are observed. - Follow-up mammogram scheduled for July 24 to monitor for changes -  Consider fine needle aspiration if changes are observed in future imaging  Hypertension Blood pressure elevated at 142/92 mmHg, attributed to stress from recent breast findings. No symptoms such as headaches or vision changes reported. Currently on a low dose of medication, which is well tolerated. Discussed potential dosage increase if necessary. Advised to monitor blood pressure at home to assess if stress is causing temporary elevation. - Check blood pressure at home in the morning and evening for four days and report readings on Monday - Evaluate home blood  pressure readings to determine if medication adjustment is needed - Monitor kidney and liver function  General Health Maintenance Emphasized the importance of regular mammograms for Avel Ogawa detection despite potential anxiety from findings. Explained that mammograms are effective in identifying issues Aeric Burnham, though they can detect benign anomalies requiring monitoring. - Continue regular mammogram screenings as recommended No follow-ups on file.  Catheline Doing, DNP, AGNP-c

## 2024-02-24 LAB — CBC WITH DIFFERENTIAL/PLATELET
Basophils Absolute: 0 10*3/uL (ref 0.0–0.2)
Basos: 1 %
EOS (ABSOLUTE): 0 10*3/uL (ref 0.0–0.4)
Eos: 1 %
Hematocrit: 42.3 % (ref 34.0–46.6)
Hemoglobin: 13.9 g/dL (ref 11.1–15.9)
Immature Grans (Abs): 0 10*3/uL (ref 0.0–0.1)
Immature Granulocytes: 0 %
Lymphocytes Absolute: 1.1 10*3/uL (ref 0.7–3.1)
Lymphs: 27 %
MCH: 29.9 pg (ref 26.6–33.0)
MCHC: 32.9 g/dL (ref 31.5–35.7)
MCV: 91 fL (ref 79–97)
Monocytes Absolute: 0.4 10*3/uL (ref 0.1–0.9)
Monocytes: 9 %
Neutrophils Absolute: 2.5 10*3/uL (ref 1.4–7.0)
Neutrophils: 62 %
Platelets: 341 10*3/uL (ref 150–450)
RBC: 4.65 x10E6/uL (ref 3.77–5.28)
RDW: 12.1 % (ref 11.7–15.4)
WBC: 3.9 10*3/uL (ref 3.4–10.8)

## 2024-02-24 LAB — COMPREHENSIVE METABOLIC PANEL WITH GFR
ALT: 15 IU/L (ref 0–32)
AST: 17 IU/L (ref 0–40)
Albumin: 4.3 g/dL (ref 3.9–4.9)
Alkaline Phosphatase: 54 IU/L (ref 44–121)
BUN/Creatinine Ratio: 11 (ref 9–23)
BUN: 9 mg/dL (ref 6–24)
Bilirubin Total: 0.8 mg/dL (ref 0.0–1.2)
CO2: 19 mmol/L — AB (ref 20–29)
Calcium: 9.9 mg/dL (ref 8.7–10.2)
Chloride: 101 mmol/L (ref 96–106)
Creatinine, Ser: 0.81 mg/dL (ref 0.57–1.00)
Globulin, Total: 2.8 g/dL (ref 1.5–4.5)
Glucose: 85 mg/dL (ref 70–99)
Potassium: 4.2 mmol/L (ref 3.5–5.2)
Sodium: 137 mmol/L (ref 134–144)
Total Protein: 7.1 g/dL (ref 6.0–8.5)
eGFR: 94 mL/min/{1.73_m2} (ref >59)

## 2024-03-02 ENCOUNTER — Other Ambulatory Visit

## 2024-03-02 ENCOUNTER — Ambulatory Visit: Payer: Self-pay

## 2024-03-02 ENCOUNTER — Encounter: Payer: Self-pay | Admitting: Nurse Practitioner

## 2024-03-02 NOTE — Telephone Encounter (Signed)
 FYI Only or Action Required?: Action required by provider: clinical question for provider.  Patient was last seen in primary care on 02/23/2024 by Early, Camie BRAVO, NP.  Called Nurse Triage reporting Hypertension.  Symptoms began today.  Interventions attempted: Prescription medications: nebivolol ,valsartan -hctz .  Symptoms are: elevated BP 150/90 stable.  Triage Disposition: Call PCP Now (overriding See PCP Within 2 Weeks)  Patient/caregiver understands and will follow disposition?:  Yes          Copied from CRM [id number]. Topic: Clinical - Medication Question >> Mar 02, 2024 12:00 PM Donna BRAVO wrote: Reason for CRM: patient calling to inform Camie Doing NP about BP.  BP this morning  150/90 was taken around 9:30am patient  stated if BP still elevated to call to adjust dosage. Reason for Disposition  [1] Systolic BP >= 130 OR Diastolic >= 80 AND [2] taking BP medications  Answer Assessment - Initial Assessment Questions Patient states she doesn't have a log over the past few days due to she had change the batteries in her BP machine, states today is the first day she checked at home. Patient would like to know what changes she should make to her medications, if she should be increasing dosages or starting any new medications. Informed patient that message from this morning was sent to Dr Joyce and awaiting response. Advised patient to call back for new or worsening symptoms and to check BP this evening as well.  1. BLOOD PRESSURE: What is your blood pressure? Did you take at least two measurements 5 minutes apart?     150/90.  2. ONSET: When did you take your blood pressure?     This morning before BP meds.  3. HOW: How did you take your blood pressure? (e.g., automatic home BP monitor, visiting nurse)     Automatic home BP monitor.  4. HISTORY: Do you have a history of high blood pressure?     Yes.  5. MEDICINES: Are you taking any medicines for blood pressure?  Have you missed any doses recently?     Valsartan -hydrochlorothiazide  and nebivolol . No missed doses or changes to medication.  6. OTHER SYMPTOMS: Do you have any symptoms? (e.g., blurred vision, chest pain, difficulty breathing, headache, weakness)     Patient denies any chest pain, difficulty breathing, facial droop, unilateral numbness or weakness, changes in speech or vision.  7. PREGNANCY: Is there any chance you are pregnant? When was your last menstrual period?     LMP:on birth control and states she skips her periods.  Protocols used: Blood Pressure - High-A-AH

## 2024-03-15 ENCOUNTER — Ambulatory Visit: Payer: Self-pay | Admitting: Nurse Practitioner

## 2024-03-15 DIAGNOSIS — N63 Unspecified lump in unspecified breast: Secondary | ICD-10-CM | POA: Insufficient documentation

## 2024-03-16 ENCOUNTER — Ambulatory Visit
Admission: RE | Admit: 2024-03-16 | Discharge: 2024-03-16 | Disposition: A | Discharge status: Home / Self Care | Source: Ambulatory Visit | Attending: Nurse Practitioner | Admitting: Nurse Practitioner

## 2024-03-16 ENCOUNTER — Other Ambulatory Visit: Payer: Self-pay | Admitting: Nurse Practitioner

## 2024-03-16 ENCOUNTER — Other Ambulatory Visit: Payer: 59

## 2024-03-16 DIAGNOSIS — N6310 Unspecified lump in the right breast, unspecified quadrant: Secondary | ICD-10-CM

## 2024-03-16 DIAGNOSIS — N6489 Other specified disorders of breast: Secondary | ICD-10-CM

## 2024-03-20 ENCOUNTER — Ambulatory Visit: Payer: Self-pay | Admitting: Nurse Practitioner

## 2024-03-30 ENCOUNTER — Telehealth: Admitting: Nurse Practitioner

## 2024-03-30 ENCOUNTER — Encounter: Payer: Self-pay | Admitting: Nurse Practitioner

## 2024-03-30 VITALS — Wt 188.0 lb

## 2024-03-30 DIAGNOSIS — I10 Essential (primary) hypertension: Secondary | ICD-10-CM

## 2024-03-30 DIAGNOSIS — J40 Bronchitis, not specified as acute or chronic: Secondary | ICD-10-CM | POA: Diagnosis not present

## 2024-03-30 DIAGNOSIS — J0100 Acute maxillary sinusitis, unspecified: Secondary | ICD-10-CM | POA: Diagnosis not present

## 2024-03-30 MED ORDER — AZITHROMYCIN 250 MG PO TABS: ORAL_TABLET | ORAL | 0 refills | Status: AC

## 2024-03-30 MED ORDER — AIRSUPRA 90-80 MCG/ACT IN AERO: 2.0000 | INHALATION_SPRAY | Freq: Four times a day (QID) | RESPIRATORY_TRACT | 3 refills | Status: AC | PRN

## 2024-03-30 MED ORDER — VALSARTAN-HYDROCHLOROTHIAZIDE 160-25 MG PO TABS: 1.0000 | ORAL_TABLET | Freq: Every day | ORAL | 3 refills | Status: AC

## 2024-03-30 MED ORDER — HYDROCODONE BIT-HOMATROP MBR 5-1.5 MG/5ML PO SOLN: 5.0000 mL | Freq: Three times a day (TID) | ORAL | 0 refills | Status: DC | PRN

## 2024-03-30 NOTE — Patient Instructions (Signed)
 I have sent the inhaler for AIRSUPRA . Look online at [email or web address] .[email or web address] to get the coupon. This will allow you to get 3 inhalers.

## 2024-03-30 NOTE — Progress Notes (Addendum)
 Virtual Visit Encounter mychart visit.   I connected with  Julie Lucas on 03/30/24 at  3:45 PM EDT by secure video and audio telemedicine application. I verified that I am speaking with the correct person using two identifiers.   I introduced myself as a Publishing rights manager with the practice. The limitations of evaluation and management by telemedicine discussed with the patient and the availability of in person appointments. The patient expressed verbal understanding and consent to proceed.  Participating parties in this visit include: Myself and patient  The patient is: Patient Location: Home I am: Provider Location: Office/Clinic Subjective:    CC and HPI: History of Present Illness Julie Lucas is a 43 year old female who presents with sinus congestion, cough, and sinus pain.  She has been experiencing sinus congestion, a stuffy and runny nose, and a bad cough. Sinus pain and pressure are localized in her cheekbones. These symptoms began on Sunday night with a scratchy throat, initially attributed to sinus drainage and weather changes. By Monday, the throat itchiness persisted, followed by nasal congestion and facial pain.  She has been using warm compresses for relief and taking Tylenol Sinus, which she feels helps to alleviate symptoms. However, the cough is particularly bothersome at night when lying down, disrupting her sleep. She recalls using a cough syrup with codeine  in the past, which helped manage similar symptoms.  She mentions a history of being prescribed an inhaler for persistent cough during allergy season, which she found beneficial. She is allergic to Augmentin, which causes gastrointestinal upset, but has previously tolerated azithromycin .  She has been doubling her blood pressure medication as advised, noting recent readings of 122/84 and 117/85, though she has not checked it this week due to feeling unwell.  Past medical history, Surgical history,  Family history not pertinant except as noted below, Social history, Allergies, and medications have been entered into the medical record, reviewed, and corrections made.   Review of Systems:  All review of systems negative except what is listed in the HPI  Objective:    Alert and oriented x 4 Audible congestion Speaking in clear sentences with no shortness of breath. No distress.  Impression and Recommendations:    Problem List Items Addressed This Visit     Essential hypertension   Relevant Medications   valsartan -hydrochlorothiazide  (DIOVAN -HCT) 160-25 MG tablet   Other Visit Diagnoses       Acute non-recurrent maxillary sinusitis    -  Primary   Relevant Medications   azithromycin  (ZITHROMAX ) 250 MG tablet   HYDROcodone  bit-homatropine (HYCODAN) 5-1.5 MG/5ML syrup     Bronchitis       Relevant Medications   Albuterol -Budesonide (AIRSUPRA ) 90-80 MCG/ACT AERO     Assessment and Plan Acute sinusitis with cough Acute sinusitis with symptoms of sinus pressure in the cheekbones, nasal congestion, and persistent cough. Symptoms began with a scratchy throat, progressing to facial pain and congestion. Differential diagnosis includes COVID-19, but symptoms align more with sinus infection due to weather changes. Cough worsens at night due to postnasal drip. - Prescribe azithromycin  for sinusitis. - Prescribe cough syrup with codeine  to manage cough and aid sleep. - Advise continuation of Tylenol Sinus for symptom relief. - Recommend over-the-counter COVID-19 test if symptoms persist or worsen. - Instruct to contact the office if symptoms do not improve by next week.  Cough variant asthma Cough variant asthma with previous effective use of an inhaler during allergy seasons. Current inhaler expired, and she reports past benefit. -  Prescribe Air Supra inhaler, containing albuterol  and an inhaled steroid, as recommended by the Allergy and Ast[employer] Society. - Provide information on  obtaining a coupon for Air Supra to reduce cost.  Essential hypertension Hypertension is well-controlled with current medication regimen. Recent blood pressure readings were 122/84 and 117/85. She has been doubling her antihypertensive medication as previously advised. - Continue current antihypertensive regimen at doubled dose. - Send in prescription for the new dose to ensure adequate supply.  orders and follow up as documented in EMR I discussed the assessment and treatment plan with the patient. The patient was provided an opportunity to ask questions and all were answered. The patient agreed with the plan and demonstrated an understanding of the instructions.   The patient was advised to call back or seek an in-person evaluation if the symptoms worsen or if the condition fails to improve as anticipated.  Follow-Up: prn  I provided 36 minutes of non-face-to-face interaction with this non face-to-face encounter including intake, same-day documentation, and chart review.   Camie CHARLENA Doing, NP , DNP, AGNP-c [facility]

## 2024-08-07 ENCOUNTER — Encounter: Payer: Self-pay | Admitting: Family Medicine

## 2024-08-07 ENCOUNTER — Telehealth: Admitting: Family Medicine

## 2024-08-07 ENCOUNTER — Encounter: Payer: Self-pay | Admitting: Nurse Practitioner

## 2024-08-07 DIAGNOSIS — J0101 Acute recurrent maxillary sinusitis: Secondary | ICD-10-CM

## 2024-08-07 MED ORDER — AZITHROMYCIN 250 MG PO TABS: ORAL_TABLET | ORAL | 0 refills | Status: AC

## 2024-08-07 MED ORDER — FLUTICASONE PROPIONATE 50 MCG/ACT NA SUSP: 2.0000 | Freq: Every day | NASAL | 6 refills | Status: AC

## 2024-08-07 NOTE — Telephone Encounter (Signed)
 Appt made.

## 2024-08-07 NOTE — Progress Notes (Signed)
° °  Name: Julie Lucas   Date of Visit: 08/07/2024   Date of last visit with me: Visit date not found   Telehealth visit conducted via video.  Both patient and physician are located in [facility].  CHIEF COMPLAINT:  Chief Complaint  Patient presents with   Acute Visit    Sinus infection.        HPI:  Discussed the use of AI scribe software for clinical note transcription with the patient, who gave verbal consent to proceed.  History of Present Illness Julie Lucas is a 43 year old female who presents with sinus pressure and congestion.  She has been experiencing significant sinus pressure, particularly around her eyes, which began on Friday. The pressure is described as very painful and has been persistent.  Congestion started on Saturday evening, following the onset of the sinus pressure. She has been using Tylenol Sinus, which provides temporary relief, but the pain returns once the medication wears off. Warm compresses have also been used to alleviate the pressure.  She has a history of similar episodes and is familiar with the symptoms. She is currently using Nasacort nasal spray as needed, but not on a daily basis. She also has some cough medicine available from previous episodes.     OBJECTIVE:       02/23/2024    9:09 AM  Depression screen PHQ 2/9  Decreased Interest 0  Down, Depressed, Hopeless 0  PHQ - 2 Score 0     BP Readings from Last 3 Encounters:  03/02/24 (!) 150/92  02/23/24 (!) 142/92  08/16/23 138/88    There were no vitals taken for this visit.   Physical Exam    Physical Exam Constitutional:      Appearance: Normal appearance.  Neurological:     Mental Status: She is alert.     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 - Prescribed Z-Pak (azithromycin ). - Recommended daily Flonase  (fluticasone  propionate) nasal spray, especially at night. - Advised use of a humidifier to prevent congestion. - Instructed to follow up with primary care if symptoms recur within months.     Laddie Naeem A. Vita MD [facility]

## 2024-08-11 ENCOUNTER — Other Ambulatory Visit: Payer: Self-pay | Admitting: Nurse Practitioner

## 2024-08-11 DIAGNOSIS — I10 Essential (primary) hypertension: Secondary | ICD-10-CM

## 2024-08-22 ENCOUNTER — Other Ambulatory Visit: Payer: Self-pay | Admitting: Nurse Practitioner

## 2024-08-22 DIAGNOSIS — Z3041 Encounter for surveillance of contraceptive pills: Secondary | ICD-10-CM

## 2024-08-31 ENCOUNTER — Encounter: Payer: Self-pay | Admitting: Nurse Practitioner

## 2024-08-31 ENCOUNTER — Ambulatory Visit (INDEPENDENT_AMBULATORY_CARE_PROVIDER_SITE_OTHER): Payer: 59 | Admitting: Nurse Practitioner

## 2024-08-31 VITALS — BP 128/82 | HR 79 | Ht 64.25 in | Wt 194.8 lb

## 2024-08-31 DIAGNOSIS — Z0000 Encounter for general adult medical examination without abnormal findings: Secondary | ICD-10-CM

## 2024-08-31 DIAGNOSIS — J3089 Other allergic rhinitis: Secondary | ICD-10-CM | POA: Diagnosis not present

## 2024-08-31 DIAGNOSIS — E559 Vitamin D deficiency, unspecified: Secondary | ICD-10-CM | POA: Diagnosis not present

## 2024-08-31 DIAGNOSIS — Z1329 Encounter for screening for other suspected endocrine disorder: Secondary | ICD-10-CM

## 2024-08-31 DIAGNOSIS — Z6833 Body mass index (BMI) 33.0-33.9, adult: Secondary | ICD-10-CM

## 2024-08-31 DIAGNOSIS — Z1321 Encounter for screening for nutritional disorder: Secondary | ICD-10-CM | POA: Diagnosis not present

## 2024-08-31 DIAGNOSIS — Z130 Encounter for screening for diseases of the blood and blood-forming organs and certain disorders involving the immune mechanism: Secondary | ICD-10-CM

## 2024-08-31 DIAGNOSIS — I10 Essential (primary) hypertension: Secondary | ICD-10-CM | POA: Diagnosis not present

## 2024-08-31 DIAGNOSIS — Z13228 Encounter for screening for other metabolic disorders: Secondary | ICD-10-CM | POA: Diagnosis not present

## 2024-08-31 MED ORDER — LEVOCETIRIZINE DIHYDROCHLORIDE 5 MG PO TABS: 5.0000 mg | ORAL_TABLET | Freq: Every evening | ORAL | 3 refills | Status: AC

## 2024-08-31 NOTE — Assessment & Plan Note (Signed)

## 2024-08-31 NOTE — Assessment & Plan Note (Signed)
 Blood pressure is well-controlled at 128/82 mmHg with current medication regimen. No reported side effects such as headaches or vision changes. - Continue current antihypertensive regimen: valsartan , hydrochlorothiazide , and bisoprolol. Orders:   CBC with Differential/Platelet   CMP14+EGFR   Hemoglobin A1c   Lipid panel   VITAMIN D  25 Hydroxy (Vit-D Deficiency, Fractures)

## 2024-08-31 NOTE — Assessment & Plan Note (Signed)
 Will send levocetirizine to the pharmacy to see if we can get insurance coverage for this. Continue with daily use at bedtime to allergies.  Orders:   levocetirizine (XYZAL ) 5 MG tablet; Take 1 tablet (5 mg total) by mouth every evening.

## 2024-08-31 NOTE — Patient Instructions (Addendum)
 I sent in the xyzal  for you through the pharmacy to see if it is less expensive with your insurance.   Keep monitoring your breasts and let me know if you have any changes.     For all adult patients, I recommend A well balanced diet low in saturated fats, cholesterol, and moderation in carbohydrates.   This can be as simple as monitoring portion sizes and cutting back on sugary beverages such as soda and juice to start with.    Daily water consumption of at least 64 ounces.  Physical activity at least 180 minutes per week, if just starting out.   This can be as simple as taking the stairs instead of the elevator and walking 2-3 laps around the office  purposefully every day.   STD protection, partner selection, and regular testing if high risk.  Limited consumption of alcoholic beverages if alcohol is consumed.  For women, I recommend no more than 7 alcoholic beverages per week, spread out throughout the week.  Avoid binge drinking or consuming large quantities of alcohol in one setting.   Please let me know if you feel you may need help with reduction or quitting alcohol consumption.   Avoidance of nicotine, if used.  Please let me know if you feel you may need help with reduction or quitting nicotine use.   Daily mental health attention.  This can be in the form of 5 minute daily meditation, prayer, journaling, yoga, reflection, etc.   Purposeful attention to your emotions and mental state can significantly improve your overall wellbeing  and  Health.  Please know that I am here to help you with all of your health care goals and am happy to work with you to find a solution that works best for you.  The greatest advice I have received with any changes in life are to take it one step at a time, that even means if all you can focus on is the next 60 seconds, then do that and celebrate your victories.  With any changes in life, you will have set backs, and that is OK. The important  thing to remember is, if you have a set back, it is not a failure, it is an opportunity to try again!  Health Maintenance Recommendations Screening Testing Mammogram Every 1 -2 years based on history and risk factors Starting at age 31 Pap Smear Ages 21-39 every 3 years Ages 51-65 every 5 years with HPV testing More frequent testing may be required based on results and history Colon Cancer Screening Every 1-10 years based on test performed, risk factors, and history Starting at age 32 Bone Density Screening Every 2-10 years based on history Starting at age 12 for women Recommendations for men differ based on medication usage, history, and risk factors AAA Screening One time ultrasound Men 47-29 years old who have every smoked Lung Cancer Screening Low Dose Lung CT every 12 months Age 39-80 years with a 30 pack-year smoking history who still smoke or who have quit within the last 15 years  Screening Labs Routine  Labs: Complete Blood Count (CBC), Complete Metabolic Panel (CMP), Cholesterol (Lipid Panel) Every 6-12 months based on history and medications May be recommended more frequently based on current conditions or previous results Hemoglobin A1c Lab Every 3-12 months based on history and previous results Starting at age 46 or earlier with diagnosis of diabetes, high cholesterol, BMI >26, and/or risk factors Frequent monitoring for patients with diabetes to ensure blood sugar  control Thyroid Panel (TSH w/ T3 & T4) Every 6 months based on history, symptoms, and risk factors May be repeated more often if on medication HIV One time testing for all patients 23 and older May be repeated more frequently for patients with increased risk factors or exposure Hepatitis C One time testing for all patients 57 and older May be repeated more frequently for patients with increased risk factors or exposure Gonorrhea, Chlamydia Every 12 months for all sexually active persons 13-24  years Additional monitoring may be recommended for those who are considered high risk or who have symptoms PSA Men 27-36 years old with risk factors Additional screening may be recommended from age 31-69 based on risk factors, symptoms, and history  Vaccine Recommendations Tetanus Booster All adults every 10 years Flu Vaccine All patients 6 months and older every year COVID Vaccine All patients 12 years and older Initial dosing with booster May recommend additional booster based on age and health history HPV Vaccine 2 doses all patients age 44-26 Dosing may be considered for patients over 26 Shingles Vaccine (Shingrix) 2 doses all adults 55 years and older Pneumonia (Pneumovax 23) All adults 65 years and older May recommend earlier dosing based on health history Pneumonia (Prevnar 82) All adults 65 years and older Dosed 1 year after Pneumovax 23  Additional Screening, Testing, and Vaccinations may be recommended on an individualized basis based on family history, health history, risk factors, and/or exposure.

## 2024-08-31 NOTE — Progress Notes (Signed)
 Tetanus (TDAP) Vaccine Pt declined.  Julie Doing, DNP, AGNP-c [facility] [address] [place], [place] [address] Main Office [phone] VISIT TYPE: CPE on 08/31/2024 Today's Vitals   08/31/24 0833  BP: 128/82  Pulse: 79  Weight: 194 lb 12.8 oz (88.4 kg)  Height: 5' 4.25 (1.632 m)   Body mass index is 33.18 kg/m.  Wt Readings from Last 3 Encounters:  08/31/24 194 lb 12.8 oz (88.4 kg)  03/30/24 188 lb (85.3 kg)  02/23/24 192 lb (87.1 kg)     Subjective:  Annual Exam (CPE, fasting labs, )  History of Present Illness Julie Lucas is a 44 year old female who presents for an annual exam.   She has a history of hypertension, managed with valsartan /hydrochlorothiazide  160/25 mg and bisoprolol 2.5 mg. She has no issues with headaches or vision changes.  Recently, she experienced sinus issues lasting about a week and a half, with a sinus headache occurring yesterday. She denies any fullness in her throat or difficulty swallowing.  She uses Xyzal  (levocetirizine) for allergies, which she purchases over the counter. She has minimal use of her inhaler.  She denies chest pain, shortness of breath, changes in bowel or bladder habits, swelling in feet or ankles, and joint issues, except for occasional knee discomfort attributed to 'runner's knee'.  She is on continuous birth control with a recent refill and reports no new skin changes, lumps, or bumps.  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  Socially, she works in an office two days a week, has cut out sugary drinks in favor of zero sugar or diet drinks, and plans to resume yoga and Pilates classes after a break since November.  Pertinent items are noted in HPI.     08/31/2024    8:33 AM 02/23/2024    9:09 AM 08/16/2023    8:20 AM 08/14/2021    3:09 PM 01/09/2020   10:05 AM  Depression screen PHQ 2/9  Decreased Interest 0  0 0 0 0  Down, Depressed, Hopeless 0 0 0 0 0  PHQ - 2 Score 0 0 0 0 0       08/14/2021    3:10 PM  GAD 7 : Generalized Anxiety Score  Nervous, Anxious, on Edge 0  Control/stop worrying 0  Worry too much - different things 1  Trouble relaxing 0  Restless 0  Easily annoyed or irritable 0  Afraid - awful might happen 0  Total GAD 7 Score 1  Anxiety Difficulty Not difficult at all       08/31/2024    8:33 AM 02/23/2024    9:09 AM 08/16/2023    8:20 AM 08/14/2021    3:09 PM 01/09/2020   10:04 AM  Fall Risk   Falls in the past year? 0 0 0 0 0  Number falls in past yr: 0 0 0 0 0  Injury with Fall? 0 0  0  0  0   Risk for fall due to : No Fall Risks No Fall Risks No Fall Risks No Fall Risks   Follow up Falls evaluation completed Falls evaluation completed Falls evaluation completed Falls evaluation completed;Education provided       Data saved with a previous flowsheet row definition   Past medical history, surgical history, medications, allergies, family history and social history reviewed with patient today and changes made to appropriate areas of the chart.  Objective:    Physical Exam Vitals and nursing note reviewed.  Constitutional:      General: She is not in acute distress.    Appearance: Normal appearance. She is not ill-appearing.  HENT:     Head: Normocephalic.     Right Ear: Ear canal and external ear normal.     Left Ear: Ear canal and external ear normal.     Ears:     Comments: Clear effusion    Nose:     Comments: Mask in place    Mouth/Throat:     Comments: Mask in place Eyes:     General: No scleral icterus.    Pupils: Pupils are equal, round, and reactive to light.  Neck:     Vascular: No carotid bruit.  Cardiovascular:     Rate and Rhythm: Normal rate and regular rhythm.     Pulses: Normal pulses.     Heart sounds: Normal heart sounds.  Pulmonary:     Effort: Pulmonary effort is normal.     Breath sounds: Normal breath sounds.  Abdominal:      General: Bowel sounds are normal. There is no distension.     Palpations: Abdomen is soft. There is no mass.     Tenderness: There is no abdominal tenderness. There is no right CVA tenderness, left CVA tenderness, guarding or rebound.  Musculoskeletal:        General: Normal range of motion.     Cervical back: Neck supple. No tenderness.     Right lower leg: No edema.     Left lower leg: No edema.  Lymphadenopathy:     Cervical: No cervical adenopathy.  Skin:    General: Skin is warm and dry.     Capillary Refill: Capillary refill takes less than 2 seconds.  Neurological:     Mental Status: She is alert and oriented to person, place, and time.     Sensory: No sensory deficit.     Motor: No weakness.     Coordination: Coordination normal.  Psychiatric:        Mood and Affect: Mood normal.        Behavior: Behavior normal.         Assessment & Plan:   Assessment & Plan Encounter for annual physical exam CPE completed today. Review of HM activities and recommendations discussed and provided on AVS. Anticipatory guidance, diet, and exercise recommendations provided. Medications, allergies, and hx reviewed and updated as necessary. Orders placed as listed below.  Plan: - Labs ordered. Will make changes as necessary based on results.  - I will review these results and send recommendations via MyChart or a telephone call.  - F/U with CPE in 1 year or sooner for acute/chronic health needs as directed.      Vitamin D  deficiency Will recheck labs today for monitoring. Orders:   CBC with Differential/Platelet   CMP14+EGFR   Hemoglobin A1c   Lipid panel   VITAMIN D  25 Hydroxy (Vit-D Deficiency, Fractures)  Essential hypertension Blood pressure is well-controlled at 128/82 mmHg with current medication regimen. No reported side effects such as headaches or vision changes. - Continue current antihypertensive regimen: valsartan , hydrochlorothiazide , and bisoprolol. Orders:    CBC with Differential/Platelet   CMP14+EGFR   Hemoglobin A1c   Lipid panel   VITAMIN D  25 Hydroxy (Vit-D Deficiency, Fractures)  BMI 33.0-33.9,adult Working on diet and avoiding drinks with sugar in them. Recommend continue with routine exercise.  Orders:   CBC with Differential/Platelet  CMP14+EGFR   Hemoglobin A1c   Lipid panel   VITAMIN D  25 Hydroxy (Vit-D Deficiency, Fractures)  Screening for endocrine, nutritional, metabolic and immunity disorder  Orders:   CBC with Differential/Platelet   CMP14+EGFR   Hemoglobin A1c   Lipid panel   VITAMIN D  25 Hydroxy (Vit-D Deficiency, Fractures)  Non-seasonal allergic rhinitis, unspecified trigger Will send levocetirizine to the pharmacy to see if we can get insurance coverage for this. Continue with daily use at bedtime to allergies.  Orders:   levocetirizine (XYZAL ) 5 MG tablet; Take 1 tablet (5 mg total) by mouth every evening.  NEXT PREVENTATIVE PHYSICAL DUE IN 1 YEAR.    PATIENT COUNSELING PROVIDED FOR ALL ADULT PATIENTS: A well balanced diet low in saturated fats, cholesterol, and moderation in carbohydrates.  This can be as simple as monitoring portion sizes and cutting back on sugary beverages such as soda and juice to start with.    Daily water consumption of at least 64 ounces.  Physical activity at least 180 minutes per week.  If just starting out, start 10 minutes a day and work your way up.   This can be as simple as taking the stairs instead of the elevator and walking 2-3 laps around the office  purposefully every day.   STD protection, partner selection, and regular testing if high risk.  Limited consumption of alcoholic beverages if alcohol is consumed. For men, I recommend no more than 14 alcoholic beverages per week, spread out throughout the week (max 2 per day). Avoid binge drinking or consuming large quantities of alcohol in one setting.  Please let me know if you feel you may need help with reduction  or quitting alcohol consumption.   Avoidance of nicotine, if used. Please let me know if you feel you may need help with reduction or quitting nicotine use.   Daily mental health attention. This can be in the form of 5 minute daily meditation, prayer, journaling, yoga, reflection, etc.  Purposeful attention to your emotions and mental state can significantly improve your overall wellbeing  and Health.  Please know that I am here to help you with all of your health care goals and am happy to work with you to find a solution that works best for you.  The greatest advice I have received with any changes in life are to take it one step at a time, that even means if all you can focus on is the next 60 seconds, then do that and celebrate your victories.  With any changes in life, you will have set backs, and that is OK. The important thing to remember is, if you have a set back, it is not a failure, it is an opportunity to try again! Screening Testing Mammogram Every 1 -2 years based on history and risk factors Starting at age 32 Pap Smear Ages 21-39 every 3 years Ages 84-65 every 5 years with HPV testing More frequent testing may be required based on results and history Colon Cancer Screening Every 1-10 years based on test performed, risk factors, and history Starting at age 72 Bone Density Screening Every 2-10 years based on history Starting at age 76 for women Recommendations for men differ based on medication usage, history, and risk factors AAA Screening One time ultrasound Men 87-27 years old who have every smoked Lung Cancer Screening Low Dose Lung CT every 12 months Age 24-80 years with a 30 pack-year smoking history who still smoke or who have quit within the  last 15 years   Screening Labs Routine  Labs: Complete Blood Count (CBC), Complete Metabolic Panel (CMP), Cholesterol (Lipid Panel) Every 6-12 months based on history and medications May be recommended more frequently  based on current conditions or previous results Hemoglobin A1c Lab Every 3-12 months based on history and previous results Starting at age 38 or earlier with diagnosis of diabetes, high cholesterol, BMI >26, and/or risk factors Frequent monitoring for patients with diabetes to ensure blood sugar control Thyroid Panel (TSH) Every 6 months based on history, symptoms, and risk factors May be repeated more often if on medication HIV One time testing for all patients 81 and older May be repeated more frequently for patients with increased risk factors or exposure Hepatitis C One time testing for all patients 36 and older May be repeated more frequently for patients with increased risk factors or exposure Gonorrhea, Chlamydia Every 12 months for all sexually active persons 13-24 years Additional monitoring may be recommended for those who are considered high risk or who have symptoms Every 12 months for any woman on birth control, regardless of sexual activity PSA Men 30-34 years old with risk factors Additional screening may be recommended from age 10-69 based on risk factors, symptoms, and history  Vaccine Recommendations Tetanus Booster All adults every 10 years Flu Vaccine All patients 6 months and older every year COVID Vaccine All patients 12 years and older Initial dosing with booster May recommend additional booster based on age and health history HPV Vaccine 2 doses all patients age 77-26 Dosing may be considered for patients over 26 Shingles Vaccine (Shingrix) 2 doses all adults 55 years and older Pneumonia (Pneumovax 23) All adults 65 years and older May recommend earlier dosing based on health history One year apart from Prevnar 13 Pneumonia (Prevnar 60) All adults 65 years and older Dosed 1 year after Pneumovax 23 Pneumonia (Prevnar 20) One time alternative to the two dosing of 13 and 23 For all adults with initial dose of 23, 20 is recommended 1 year later For all  adults with initial dose of 13, 23 is still recommended as second option 1 year later

## 2024-08-31 NOTE — Assessment & Plan Note (Signed)
 Will recheck labs today for monitoring. Orders:   CBC with Differential/Platelet   CMP14+EGFR   Hemoglobin A1c   Lipid panel   VITAMIN D  25 Hydroxy (Vit-D Deficiency, Fractures)

## 2024-09-01 LAB — CMP14+EGFR
ALT: 25 IU/L (ref 0–32)
AST: 20 IU/L (ref 0–40)
Albumin: 4 g/dL (ref 3.9–4.9)
Alkaline Phosphatase: 50 IU/L (ref 41–116)
BUN/Creatinine Ratio: 12 (ref 9–23)
BUN: 9 mg/dL (ref 6–24)
Bilirubin Total: 0.8 mg/dL (ref 0.0–1.2)
CO2: 23 mmol/L (ref 20–29)
Calcium: 9.6 mg/dL (ref 8.7–10.2)
Chloride: 101 mmol/L (ref 96–106)
Creatinine, Ser: 0.76 mg/dL (ref 0.57–1.00)
Globulin, Total: 2.9 g/dL (ref 1.5–4.5)
Glucose: 85 mg/dL (ref 70–99)
Potassium: 4.1 mmol/L (ref 3.5–5.2)
Sodium: 137 mmol/L (ref 134–144)
Total Protein: 6.9 g/dL (ref 6.0–8.5)
eGFR: 101 mL/min/1.73

## 2024-09-01 LAB — CBC WITH DIFFERENTIAL/PLATELET
Basophils Absolute: 0 x10E3/uL (ref 0.0–0.2)
Basos: 1 %
EOS (ABSOLUTE): 0.1 x10E3/uL (ref 0.0–0.4)
Eos: 2 %
Hematocrit: 41 % (ref 34.0–46.6)
Hemoglobin: 13.3 g/dL (ref 11.1–15.9)
Immature Grans (Abs): 0 x10E3/uL (ref 0.0–0.1)
Immature Granulocytes: 0 %
Lymphocytes Absolute: 1.3 x10E3/uL (ref 0.7–3.1)
Lymphs: 30 %
MCH: 30 pg (ref 26.6–33.0)
MCHC: 32.4 g/dL (ref 31.5–35.7)
MCV: 92 fL (ref 79–97)
Monocytes Absolute: 0.4 x10E3/uL (ref 0.1–0.9)
Monocytes: 9 %
Neutrophils Absolute: 2.6 x10E3/uL (ref 1.4–7.0)
Neutrophils: 58 %
Platelets: 345 x10E3/uL (ref 150–450)
RBC: 4.44 x10E6/uL (ref 3.77–5.28)
RDW: 12.5 % (ref 11.7–15.4)
WBC: 4.3 x10E3/uL (ref 3.4–10.8)

## 2024-09-01 LAB — LIPID PANEL
Chol/HDL Ratio: 3.3 ratio (ref 0.0–4.4)
Cholesterol, Total: 149 mg/dL (ref 100–199)
HDL: 45 mg/dL
LDL Chol Calc (NIH): 85 mg/dL (ref 0–99)
Triglycerides: 104 mg/dL (ref 0–149)
VLDL Cholesterol Cal: 19 mg/dL (ref 5–40)

## 2024-09-01 LAB — VITAMIN D 25 HYDROXY (VIT D DEFICIENCY, FRACTURES): Vit D, 25-Hydroxy: 87.7 ng/mL (ref 30.0–100.0)

## 2024-09-01 LAB — HEMOGLOBIN A1C
Est. average glucose Bld gHb Est-mCnc: 103 mg/dL
Hgb A1c MFr Bld: 5.2 % (ref 4.8–5.6)

## 2024-09-06 ENCOUNTER — Ambulatory Visit: Payer: Self-pay | Admitting: Nurse Practitioner

## 2024-09-22 ENCOUNTER — Encounter

## 2024-09-22 ENCOUNTER — Other Ambulatory Visit

## 2024-10-04 ENCOUNTER — Other Ambulatory Visit

## 2024-10-04 ENCOUNTER — Encounter

## 2025-03-02 ENCOUNTER — Ambulatory Visit: Admitting: Nurse Practitioner

## 2025-09-06 ENCOUNTER — Encounter: Admitting: Nurse Practitioner
